# Patient Record
Sex: Female | Born: 1960 | Race: White | Hispanic: No | Smoking: Never smoker
Health system: Southern US, Community
[De-identification: ages and names within clinical notes are randomized; demographics above are authoritative.]

## PROBLEM LIST (undated history)

## (undated) DIAGNOSIS — E669 Obesity, unspecified: Secondary | ICD-10-CM

## (undated) DIAGNOSIS — E119 Type 2 diabetes mellitus without complications: Secondary | ICD-10-CM

## (undated) DIAGNOSIS — I10 Essential (primary) hypertension: Secondary | ICD-10-CM

## (undated) DIAGNOSIS — E785 Hyperlipidemia, unspecified: Secondary | ICD-10-CM

## (undated) DIAGNOSIS — E559 Vitamin D deficiency, unspecified: Secondary | ICD-10-CM

## (undated) DIAGNOSIS — G629 Polyneuropathy, unspecified: Secondary | ICD-10-CM

## (undated) HISTORY — DX: Hyperlipidemia, unspecified: E78.5

## (undated) HISTORY — PX: KNEE SURGERY: SHX244

## (undated) HISTORY — PX: ABDOMINAL HYSTERECTOMY: SHX81

## (undated) HISTORY — DX: Type 2 diabetes mellitus without complications: E11.9

## (undated) HISTORY — DX: Essential (primary) hypertension: I10

## (undated) HISTORY — DX: Vitamin D deficiency, unspecified: E55.9

## (undated) HISTORY — DX: Polyneuropathy, unspecified: G62.9

## (undated) HISTORY — DX: Obesity, unspecified: E66.9

---

## 2012-12-28 ENCOUNTER — Other Ambulatory Visit: Payer: Self-pay | Admitting: *Deleted

## 2012-12-28 MED ORDER — METFORMIN HCL 500 MG PO TABS: 500.0000 mg | ORAL_TABLET | Freq: Two times a day (BID) | ORAL | Status: DC

## 2013-01-04 ENCOUNTER — Ambulatory Visit (INDEPENDENT_AMBULATORY_CARE_PROVIDER_SITE_OTHER): Payer: PRIVATE HEALTH INSURANCE | Admitting: Pharmacist

## 2013-01-04 VITALS — BP 110/70 | HR 70 | Ht 65.5 in | Wt 277.0 lb

## 2013-01-04 DIAGNOSIS — G629 Polyneuropathy, unspecified: Secondary | ICD-10-CM | POA: Insufficient documentation

## 2013-01-04 DIAGNOSIS — E119 Type 2 diabetes mellitus without complications: Secondary | ICD-10-CM

## 2013-01-04 DIAGNOSIS — E559 Vitamin D deficiency, unspecified: Secondary | ICD-10-CM

## 2013-01-04 DIAGNOSIS — E785 Hyperlipidemia, unspecified: Secondary | ICD-10-CM

## 2013-01-04 DIAGNOSIS — G589 Mononeuropathy, unspecified: Secondary | ICD-10-CM

## 2013-01-04 DIAGNOSIS — I10 Essential (primary) hypertension: Secondary | ICD-10-CM | POA: Insufficient documentation

## 2013-01-04 DIAGNOSIS — L301 Dyshidrosis [pompholyx]: Secondary | ICD-10-CM

## 2013-01-04 NOTE — Progress Notes (Signed)
Diabetes Follow-Up Visit Chief Complaint:   Chief Complaint  Patient presents with  . Diabetes    Filed Vitals:   01/04/13 1538  BP: 110/70  Pulse: 70   Filed Weights   01/04/13 1538  Weight: 277 lb (125.646 kg)     HPI: Diagnosed with type 2 DM in December 2013.  She has been taking metformin 500mg  bid since diagnosis.   Tolerating metformin will.   Current Diabetes Medications:  metformin (generic)  Exam Edema:  Positive - wears support stockings  Polyuria:  neg  Polydipsia:  neg Polyphagia:  neg  BMI:  There is no height or weight on file to calculate BMI.   Weight changes:  Has decreased from 291# to 277# today (14 #) General Appearance:  obese Mood/Affect:  normal   Low fat/carbohydrate diet?  Yes - has cut out sweet and reduced CHO's Nicotine Abuse?  No Medication Compliance?  Yes Exercise?  No Alcohol Abuse?  No  Home BG Monitoring:  Checking 1 times a day. Average:  130   High: 181  Low:  108   Lab Results  Component Value Date   HGBA1C 6.5 01/04/2013   RBG = 115 today (2 hours after meal)  Assessment: 1.  Diabetes.  Great A1c reduction from 7.5 to 6.5% 2.  Blood Pressure.  Well controlled 3.  Lipids.  Due to recheck but pt not fasting today  Recommendations: 1.  Medication recommendations at this time are as follows:  Continue metformin 500mg  bid 2.  Reviewed HBG goals:  Fasting 80-130 and 1-2 hour post prandial <180.  Patient is instructed to check BG 1 times per day.    3.  BP goal < 140/80. 4.  LDL goal of < 100, HDL > 40 and TG < 150. 5.  Eye Exam yearly and Dental Exam every 6 months. 6.  Dietary recommendations:  Continue to limit calories and CHO's  7.  Physical Activity recommendations:  Encouraged to start walking or other exercise she enjoys.  Start with 10 to 20 minutes at least 4 days per week - goal 30 to 40 minutes.  8.  Return to clinic in 4-6 wks   Time spent counseling patient:  30 minutes    Henrene Pastor, PharmD, CPP

## 2013-01-04 NOTE — Patient Instructions (Signed)
BLOOD GLUCOSE GOALS: Fasting  80-130 1 to 2 hours within start of a meal   Less than 180

## 2013-01-22 ENCOUNTER — Other Ambulatory Visit: Payer: Self-pay | Admitting: Nurse Practitioner

## 2013-01-22 NOTE — Telephone Encounter (Signed)
LAST OV WITH TAMMY 1/14. AIC 7.5

## 2013-01-29 ENCOUNTER — Other Ambulatory Visit: Payer: Self-pay | Admitting: Nurse Practitioner

## 2013-02-03 ENCOUNTER — Other Ambulatory Visit: Payer: Self-pay | Admitting: Nurse Practitioner

## 2013-02-05 ENCOUNTER — Encounter: Payer: Self-pay | Admitting: Nurse Practitioner

## 2013-02-05 ENCOUNTER — Ambulatory Visit (INDEPENDENT_AMBULATORY_CARE_PROVIDER_SITE_OTHER): Payer: PRIVATE HEALTH INSURANCE | Admitting: Nurse Practitioner

## 2013-02-05 VITALS — BP 123/66 | HR 63 | Temp 98.3°F | Ht 65.5 in | Wt 274.0 lb

## 2013-02-05 DIAGNOSIS — E559 Vitamin D deficiency, unspecified: Secondary | ICD-10-CM

## 2013-02-05 DIAGNOSIS — IMO0001 Reserved for inherently not codable concepts without codable children: Secondary | ICD-10-CM

## 2013-02-05 DIAGNOSIS — R609 Edema, unspecified: Secondary | ICD-10-CM

## 2013-02-05 DIAGNOSIS — E785 Hyperlipidemia, unspecified: Secondary | ICD-10-CM

## 2013-02-05 DIAGNOSIS — E1169 Type 2 diabetes mellitus with other specified complication: Secondary | ICD-10-CM | POA: Insufficient documentation

## 2013-02-05 DIAGNOSIS — I10 Essential (primary) hypertension: Secondary | ICD-10-CM

## 2013-02-05 LAB — COMPLETE METABOLIC PANEL WITH GFR
ALT: 17 U/L (ref 0–35)
Albumin: 3.7 g/dL (ref 3.5–5.2)
CO2: 27 mEq/L (ref 19–32)
Calcium: 9.2 mg/dL (ref 8.4–10.5)
Chloride: 106 mEq/L (ref 96–112)
GFR, Est African American: >89 mL/min
Sodium: 139 mEq/L (ref 135–145)
Total Protein: 6 g/dL (ref 6.0–8.3)

## 2013-02-05 LAB — POCT GLYCOSYLATED HEMOGLOBIN (HGB A1C): Hemoglobin A1C: 5.9

## 2013-02-05 MED ORDER — METFORMIN HCL 500 MG PO TABS: 500.0000 mg | ORAL_TABLET | Freq: Two times a day (BID) | ORAL | Status: DC

## 2013-02-05 MED ORDER — AMLODIPINE-OLMESARTAN 5-40 MG PO TABS: 1.0000 | ORAL_TABLET | Freq: Every day | ORAL | Status: DC

## 2013-02-05 MED ORDER — ATORVASTATIN CALCIUM 40 MG PO TABS: 40.0000 mg | ORAL_TABLET | Freq: Every day | ORAL | Status: DC

## 2013-02-05 MED ORDER — FUROSEMIDE 20 MG PO TABS: 20.0000 mg | ORAL_TABLET | Freq: Every day | ORAL | Status: DC

## 2013-02-05 MED ORDER — EZETIMIBE 10 MG PO TABS: 10.0000 mg | ORAL_TABLET | Freq: Every day | ORAL | Status: DC

## 2013-02-05 NOTE — Progress Notes (Signed)
Subjective:    Patient ID: Carla Mason, female    DOB: 10-Jul-1961, 52 y.o.   MRN: [id number]  Hypertension This is a chronic problem. The current episode started more than 1 year ago. The problem is unchanged. The problem is controlled. Associated symptoms include peripheral edema. Pertinent negatives include no blurred vision, chest pain, headaches, malaise/fatigue or palpitations. There are no associated agents to hypertension. Risk factors for coronary artery disease include diabetes mellitus, dyslipidemia, post-menopausal state, obesity and sedentary lifestyle. Past treatments include calcium channel blockers and angiotensin blockers. The current treatment provides significant improvement. Compliance problems include diet and exercise.   Hyperlipidemia This is a chronic problem. The current episode started more than 1 year ago. The problem is uncontrolled. Recent lipid tests were reviewed and are high. Exacerbating diseases include diabetes and obesity. Pertinent negatives include no chest pain. Current antihyperlipidemic treatment includes ezetimibe and statins. The current treatment provides moderate improvement of lipids. Compliance problems include adherence to diet and adherence to exercise.  Risk factors for coronary artery disease include diabetes mellitus, hypertension, obesity, post-menopausal and a sedentary lifestyle.  Diabetes She presents for her follow-up diabetic visit. She has type 2 diabetes mellitus. No MedicAlert identification noted. The initial diagnosis of diabetes was made 5 months ago. Her disease course has been stable. There are no hypoglycemic associated symptoms. Pertinent negatives for hypoglycemia include no headaches. Associated symptoms include foot paresthesias. Pertinent negatives for diabetes include no blurred vision, no chest pain, no polydipsia, no polyphagia, no polyuria and no visual change. There are no hypoglycemic complications. Symptoms are stable. Current  diabetic treatment includes oral agent (monotherapy). She is compliant with treatment all of the time. Her weight is stable. When asked about meal planning, she reported none. She has not had a previous visit with a dietician. She rarely participates in exercise. Her home blood glucose trend is decreasing steadily. Her breakfast blood glucose is taken between 7-8 am. Her breakfast blood glucose range is generally 110-130 mg/dl. Her overall blood glucose range is 110-130 mg/dl. An ACE inhibitor/angiotensin II receptor blocker is being taken. She does not see a podiatrist.Eye exam is current (oct 2013).  peripheral edema Currently on lasix- working well - still has swelling when she is on her feet at work.   Review of Systems  Constitutional: Negative for malaise/fatigue.  Eyes: Negative for blurred vision.  Cardiovascular: Negative for chest pain and palpitations.  Endocrine: Negative for polydipsia, polyphagia and polyuria.  Neurological: Negative for headaches.  All other systems reviewed and are negative.       Objective:   Physical Exam  Constitutional: She is oriented to person, place, and time. She appears well-developed and well-nourished.  HENT:  Nose: Nose normal.  Mouth/Throat: Oropharynx is clear and moist.  Eyes: EOM are normal.  Neck: Trachea normal, normal range of motion and full passive range of motion without pain. Neck supple. No JVD present. Carotid bruit is not present. No thyromegaly present.  Cardiovascular: Normal rate, regular rhythm, normal heart sounds and intact distal pulses.  Exam reveals no gallop and no friction rub.   No murmur heard. Pulmonary/Chest: Effort normal and breath sounds normal.  Abdominal: Soft. Bowel sounds are normal. She exhibits no distension and no mass. There is no tenderness.  Musculoskeletal: Normal range of motion.  Lymphadenopathy:    She has no cervical adenopathy.  Neurological: She is alert and oriented to person, place, and  time. She has normal reflexes.  Positive 3/4 monofilamnet bil feet  Skin:  Skin is warm and dry.  Early callus formation bil feet  Psychiatric: She has a normal mood and affect. Her behavior is normal. Judgment and thought content normal.   BP 123/66  Pulse 63  Temp(Src) 98.3 F (36.8 C) (Oral)  Ht 5' 5.5" (1.664 m)  Wt 274 lb (124.286 kg)  BMI 44.89 kg/m2 Results for orders placed in visit on 02/05/13  POCT GLYCOSYLATED HEMOGLOBIN (HGB A1C)      Result Value Range   Hemoglobin A1C 5.9%            Assessment & Plan:  1. Diabetes mellitus type 1, uncontrolled, without complications Low carb diet - POCT glycosylated hemoglobin (Hb A1C) - metFORMIN (GLUCOPHAGE) 500 MG tablet; Take 1 tablet (500 mg total) by mouth 2 (two) times daily with a meal.  Dispense: 180 tablet; Refill: 1  2. Hypertension Low na+ DIET - COMPLETE METABOLIC PANEL WITH GFR - amLODipine-olmesartan (AZOR) 5-40 MG per tablet; Take 1 tablet by mouth daily.  Dispense: 90 tablet; Refill: 3  3. Hyperlipidemia LOW FAT DIET AND EXERCISE - NMR Lipoprofile with Lipids - atorvastatin (LIPITOR) 40 MG tablet; Take 1 tablet (40 mg total) by mouth daily.  Dispense: 90 tablet; Refill: 3 - ezetimibe (ZETIA) 10 MG tablet; Take 1 tablet (10 mg total) by mouth daily.  Dispense: 90 tablet; Refill: 3  4. Unspecified vitamin D deficiency  - Vitamin D 25 hydroxy  5. Edema Elevate legs when sitting - furosemide (LASIX) 20 MG tablet; Take 1 tablet (20 mg total) by mouth daily.  Dispense: 90 tablet; Refill: 3   Mary-Margaret Daphine Deutscher, FNP

## 2013-02-05 NOTE — Patient Instructions (Signed)
Diets for Diabetes, Food Labeling Look at food labels to help you decide how much of a product you can eat. You will want to check the amount of total carbohydrate in a serving to see how the food fits into your meal plan. In the list of ingredients, the ingredient present in the largest amount by weight must be listed first, followed by the other ingredients in descending order. STANDARD OF IDENTITY Most products have a list of ingredients. However, foods that the Food and Drug Administration (FDA) has given a standard of identity do not need a list of ingredients. A standard of identity means that a food must contain certain ingredients if it is called a particular name. Examples are mayonnaise, peanut butter, ketchup, jelly, and cheese. LABELING TERMS There are many terms found on food labels. Some of these terms have specific definitions. Some terms are regulated by the FDA, and the FDA has clearly specified how they can be used. Others are not regulated or well-defined and can be misleading and confusing. SPECIFICALLY DEFINED TERMS Nutritive Sweetener.  A sweetener that contains calories,such as table sugar or honey. Nonnutritive Sweetener.  A sweetener with few or no calories,such as saccharin, aspartame, sucralose, and cyclamate. LABELING TERMS REGULATED BY THE FDA Free.  The product contains only a tiny or small amount of fat, cholesterol, sodium, sugar, or calories. For example, a "fat-free" product will contain less than 0.5 g of fat per serving. Low.  A food described as "low" in fat, saturated fat, cholesterol, sodium, or calories could be eaten fairly often without exceeding dietary guidelines. For example, "low in fat" means no more than 3 g of fat per serving. Lean.  "Lean" and "extra lean" are U.S. Department of Agriculture (USDA) terms for use on meat and poultry products. "Lean" means the product contains less than 10 g of fat, 4 g of saturated fat, and 95 mg of cholesterol  per serving. "Lean" is not as low in fat as a product labeled "low." Extra Lean.  "Extra lean" means the product contains less than 5 g of fat, 2 g of saturated fat, and 95 mg of cholesterol per serving. While "extra lean" has less fat than "lean," it is still higher in fat than a product labeled "low." Reduced, Less, Fewer.  A diet product that contains 25% less of a nutrient or calories than the regular version. For example, hot dogs might be labeled "25% less fat than our regular hot dogs." Light/Lite.  A diet product that contains  fewer calories or  the fat of the original. For example, "light in sodium" means a product with  the usual sodium. More.  One serving contains at least 10% more of the daily value of a vitamin, mineral, or fiber than usual. Good Source Of.  One serving contains 10% to 19% of the daily value for a particular vitamin, mineral, or fiber. Excellent Source Of.  One serving contains 20% or more of the daily value for a particular nutrient. Other terms used might be "high in" or "rich in." Enriched or Fortified.  The product contains added vitamins, minerals, or protein. Nutrition labeling must be used on enriched or fortified foods. Imitation.  The product has been altered so that it is lower in protein, vitamins, or minerals than the usual food,such as imitation peanut butter. Total Fat.  The number listed is the total of all fat found in a serving of the product. Under total fat, food labels must list saturated fat and   trans fat, which are associated with raising bad cholesterol and an increased risk of heart blood vessel disease. Saturated Fat.  Mainly fats from animal-based sources. Some examples are red meat, cheese, cream, whole milk, and coconut oil. Trans Fat.  Found in some fried snack foods, packaged foods, and fried restaurant foods. It is recommended you eat as close to 0 g of trans fat as possible, since it raises bad cholesterol and lowers  good cholesterol. Polyunsaturated and Monounsaturated Fats.  More healthful fats. These fats are from plant sources. Total Carbohydrate.  The number of carbohydrate grams in a serving of the product. Under total carbohydrate are listed the other carbohydrate sources, such as dietary fiber and sugars. Dietary Fiber.  A carbohydrate from plant sources. Sugars.  Sugars listed on the label contain all naturally occurring sugars as well as added sugars. LABELING TERMS NOT REGULATED BY THE FDA Sugarless.  Table sugar (sucrose) has not been added. However, the manufacturer may use another form of sugar in place of sucrose to sweeten the product. For example, sugar alcohols are used to sweeten foods. Sugar alcohols are a form of sugar but are not table sugar. If a product contains sugar alcohols in place of sucrose, it can still be labeled "sugarless." Low Salt, Salt-Free, Unsalted, No Salt, No Salt Added, Without Added Salt.  Food that is usually processed with salt has been made without salt. However, the food may contain sodium-containing additives, such as preservatives, leavening agents, or flavorings. Natural.  This term has no legal meaning. Organic.  Foods that are certified as organic have been inspected and approved by the USDA to ensure they are produced without pesticides, fertilizers containing synthetic ingredients, bioengineering, or ionizing radiation. Document Released: 09/12/2003 Document Revised: 12/02/2011 Document Reviewed: 03/30/2009 [employer] Patient Information 2013 [employer].  

## 2013-02-06 LAB — VITAMIN D 25 HYDROXY (VIT D DEFICIENCY, FRACTURES): Vit D, 25-Hydroxy: 35 ng/mL (ref 30–89)

## 2013-02-11 LAB — NMR LIPOPROFILE WITH LIPIDS
HDL Particle Number: 23.2 umol/L — ABNORMAL LOW (ref >=30.5)
LDL Size: 19.6 nm — ABNORMAL LOW (ref >20.5)
Large HDL-P: <1.3 umol/L — ABNORMAL LOW (ref >=4.8)
Small LDL Particle Number: 719 nmol/L — ABNORMAL HIGH (ref <=527)

## 2013-04-02 ENCOUNTER — Telehealth: Payer: Self-pay | Admitting: Nurse Practitioner

## 2013-04-02 ENCOUNTER — Ambulatory Visit (INDEPENDENT_AMBULATORY_CARE_PROVIDER_SITE_OTHER): Payer: PRIVATE HEALTH INSURANCE | Admitting: Family Medicine

## 2013-04-02 ENCOUNTER — Encounter: Payer: Self-pay | Admitting: Family Medicine

## 2013-04-02 VITALS — BP 111/68 | HR 77 | Temp 99.3°F | Wt 266.0 lb

## 2013-04-02 DIAGNOSIS — J029 Acute pharyngitis, unspecified: Secondary | ICD-10-CM

## 2013-04-02 LAB — POCT RAPID STREP A (OFFICE): Rapid Strep A Screen: NEGATIVE

## 2013-04-02 MED ORDER — AZITHROMYCIN 250 MG PO TABS: ORAL_TABLET | ORAL | Status: DC

## 2013-04-02 NOTE — Patient Instructions (Signed)
Strep Throat  Strep throat is an infection of the throat caused by a bacteria named Streptococcus pyogenes. Your caregiver may call the infection streptococcal "tonsillitis" or "pharyngitis" depending on whether there are signs of inflammation in the tonsils or back of the throat. Strep throat is most common in children aged 54 15 years during the cold months of the year, but it can occur in people of any age during any season. This infection is spread from person to person (contagious) through coughing, sneezing, or other close contact.  SYMPTOMS   · Fever or chills.  · Painful, swollen, red tonsils or throat.  · Pain or difficulty when swallowing.  · White or yellow spots on the tonsils or throat.  · Swollen, tender lymph nodes or "glands" of the neck or under the jaw.  · Red rash all over the body (rare).  DIAGNOSIS   Many different infections can cause the same symptoms. A test must be done to confirm the diagnosis so the right treatment can be given. A "rapid strep test" can help your caregiver make the diagnosis in a few minutes. If this test is not available, a light swab of the infected area can be used for a throat culture test. If a throat culture test is done, results are usually available in a day or two.  TREATMENT   Strep throat is treated with antibiotic medicine.  HOME CARE INSTRUCTIONS   · Gargle with 1 tsp of salt in 1 cup of warm water, 3 4 times per day or as needed for comfort.  · Family members who also have a sore throat or fever should be tested for strep throat and treated with antibiotics if they have the strep infection.  · Make sure everyone in your household washes their hands well.  · Do not share food, drinking cups, or personal items that could cause the infection to spread to others.  · You may need to eat a soft food diet until your sore throat gets better.  · Drink enough water and fluids to keep your urine clear or pale yellow. This will help prevent dehydration.  · Get plenty of  rest.  · Stay home from school, daycare, or work until you have been on antibiotics for 24 hours.  · Only take over-the-counter or prescription medicines for pain, discomfort, or fever as directed by your caregiver.  · If antibiotics are prescribed, take them as directed. Finish them even if you start to feel better.  SEEK MEDICAL CARE IF:   · The glands in your neck continue to enlarge.  · You develop a rash, cough, or earache.  · You cough up green, yellow-brown, or bloody sputum.  · You have pain or discomfort not controlled by medicines.  · Your problems seem to be getting worse rather than better.  SEEK IMMEDIATE MEDICAL CARE IF:   · You develop any new symptoms such as vomiting, severe headache, stiff or painful neck, chest pain, shortness of breath, or trouble swallowing.  · You develop severe throat pain, drooling, or changes in your voice.  · You develop swelling of the neck, or the skin on the neck becomes red and tender.  · You have a fever.  · You develop signs of dehydration, such as fatigue, dry mouth, and decreased urination.  · You become increasingly sleepy, or you cannot wake up completely.  Document Released: 09/06/2000 Document Revised: 08/26/2012 Document Reviewed: 11/08/2010  [employer]® Patient Information ©2014 [employer].

## 2013-04-02 NOTE — Progress Notes (Signed)
  Subjective:    Patient ID: Carla Mason, female    DOB: 12/21/1960, 52 y.o.   MRN: [id number]  HPI This 52 y.o. female presents for evaluation of sore throat for 2 days.  She has been having a sore throat  For 2 days and she is having some sinus drainage and congestion.  She did cough up some yellow Sputum this am.   Review of Systems C/o sinus drainage and sore throat.  No chest pain, SOB, HA, dizziness, vision change, N/V, diarrhea, constipation, dysuria, urinary urgency or frequency, myalgias, arthralgias or rash.     Objective:   Physical Exam  Vital signs noted  Well developed well nourished female.  HEENT - Head atraumatic Normocephalic                Eyes - PERRLA, Conjuctiva - clear Sclera- Clear EOMI                Ears - EAC's Wnl TM's Wnl Gross Hearing WNL                Nose - Nares patent                 Throat - oropharanx injected. Respiratory - Lungs CTA bilateral Cardiac - RRR S1 and S2 without murmur GI - Abdomen soft Nontender and bowel sounds active x 4 Extremities - No edema. Neuro - Grossly intact.      Assessment & Plan:  Sore throat - Plan: POCT rapid strep A, azithromycin (ZITHROMAX) 250 MG tablet Warm Salt water gargles, tylenol and motrin otc prn.    Acute pharyngitis - Plan: azithromycin (ZITHROMAX) 250 MG tablet, push po fluids, rest and follow up Prn if sx's continue or persist.

## 2013-04-02 NOTE — Telephone Encounter (Signed)
appt given for today 

## 2013-05-28 ENCOUNTER — Other Ambulatory Visit: Payer: Self-pay | Admitting: Nurse Practitioner

## 2013-06-17 ENCOUNTER — Other Ambulatory Visit: Payer: Self-pay | Admitting: Family Medicine

## 2013-07-23 ENCOUNTER — Telehealth: Payer: Self-pay | Admitting: Nurse Practitioner

## 2013-07-23 DIAGNOSIS — IMO0001 Reserved for inherently not codable concepts without codable children: Secondary | ICD-10-CM

## 2013-07-26 MED ORDER — METFORMIN HCL 500 MG PO TABS: 500.0000 mg | ORAL_TABLET | Freq: Two times a day (BID) | ORAL | Status: DC

## 2013-07-26 NOTE — Telephone Encounter (Signed)
done

## 2013-08-13 ENCOUNTER — Other Ambulatory Visit: Payer: Self-pay | Admitting: Nurse Practitioner

## 2013-08-18 ENCOUNTER — Ambulatory Visit (INDEPENDENT_AMBULATORY_CARE_PROVIDER_SITE_OTHER): Payer: PRIVATE HEALTH INSURANCE | Admitting: Nurse Practitioner

## 2013-08-18 ENCOUNTER — Encounter: Payer: Self-pay | Admitting: Nurse Practitioner

## 2013-08-18 VITALS — BP 127/82 | HR 78 | Temp 98.8°F | Ht 65.5 in | Wt 259.0 lb

## 2013-08-18 DIAGNOSIS — R609 Edema, unspecified: Secondary | ICD-10-CM

## 2013-08-18 DIAGNOSIS — I10 Essential (primary) hypertension: Secondary | ICD-10-CM

## 2013-08-18 DIAGNOSIS — E119 Type 2 diabetes mellitus without complications: Secondary | ICD-10-CM

## 2013-08-18 DIAGNOSIS — G589 Mononeuropathy, unspecified: Secondary | ICD-10-CM

## 2013-08-18 DIAGNOSIS — E785 Hyperlipidemia, unspecified: Secondary | ICD-10-CM

## 2013-08-18 DIAGNOSIS — G629 Polyneuropathy, unspecified: Secondary | ICD-10-CM

## 2013-08-18 LAB — POCT UA - MICROALBUMIN: Microalbumin Ur, POC: POSITIVE mg/L

## 2013-08-18 MED ORDER — FUROSEMIDE 20 MG PO TABS: 20.0000 mg | ORAL_TABLET | Freq: Every day | ORAL | Status: DC

## 2013-08-18 MED ORDER — AMLODIPINE-OLMESARTAN 5-40 MG PO TABS: 1.0000 | ORAL_TABLET | Freq: Every day | ORAL | Status: DC

## 2013-08-18 MED ORDER — EZETIMIBE 10 MG PO TABS: 10.0000 mg | ORAL_TABLET | Freq: Every day | ORAL | Status: DC

## 2013-08-18 MED ORDER — ATORVASTATIN CALCIUM 40 MG PO TABS: 40.0000 mg | ORAL_TABLET | Freq: Every day | ORAL | Status: DC

## 2013-08-18 MED ORDER — METFORMIN HCL 500 MG PO TABS: 500.0000 mg | ORAL_TABLET | Freq: Two times a day (BID) | ORAL | Status: DC

## 2013-08-18 NOTE — Patient Instructions (Signed)

## 2013-08-18 NOTE — Addendum Note (Signed)
Addended by: Prescott Gum on: 08/18/2013 04:41 PM   Modules accepted: Orders

## 2013-08-18 NOTE — Progress Notes (Signed)
Subjective:    Patient ID: Carla Mason, female    DOB: 01-Nov-1960, 52 y.o.   MRN: [id number]  Hypertension This is a chronic problem. The current episode started more than 1 year ago. The problem is unchanged. The problem is controlled. Associated symptoms include peripheral edema. Pertinent negatives include no blurred vision, chest pain, headaches, malaise/fatigue or palpitations. There are no associated agents to hypertension. Risk factors for coronary artery disease include diabetes mellitus, dyslipidemia, post-menopausal state, obesity and sedentary lifestyle. Past treatments include calcium channel blockers and angiotensin blockers. The current treatment provides significant improvement. Compliance problems include diet and exercise.   Hyperlipidemia This is a chronic problem. The current episode started more than 1 year ago. The problem is uncontrolled. Recent lipid tests were reviewed and are high. Exacerbating diseases include diabetes and obesity. Pertinent negatives include no chest pain. Current antihyperlipidemic treatment includes ezetimibe and statins. The current treatment provides moderate improvement of lipids. Compliance problems include adherence to diet and adherence to exercise.  Risk factors for coronary artery disease include diabetes mellitus, hypertension, obesity, post-menopausal and a sedentary lifestyle.  Diabetes She presents for her follow-up diabetic visit. She has type 2 diabetes mellitus. No MedicAlert identification noted. The initial diagnosis of diabetes was made 5 months ago. Her disease course has been stable. There are no hypoglycemic associated symptoms. Pertinent negatives for hypoglycemia include no headaches. Associated symptoms include foot paresthesias. Pertinent negatives for diabetes include no blurred vision, no chest pain, no polydipsia, no polyphagia, no polyuria and no visual change. There are no hypoglycemic complications. Symptoms are stable. Current  diabetic treatment includes oral agent (monotherapy). She is compliant with treatment all of the time. Her weight is stable. When asked about meal planning, she reported none. She has not had a previous visit with a dietician. She rarely participates in exercise. Her home blood glucose trend is decreasing steadily. Her breakfast blood glucose is taken between 7-8 am. Her breakfast blood glucose range is generally 110-130 mg/dl. Her overall blood glucose range is 110-130 mg/dl. An ACE inhibitor/angiotensin II receptor blocker is being taken. She does not see a podiatrist.Eye exam is current (oct 2013).  peripheral edema Currently on lasix- working well - still has swelling when she is on her feet at work.   Review of Systems  Constitutional: Negative for malaise/fatigue.  Eyes: Negative for blurred vision.  Cardiovascular: Negative for chest pain and palpitations.  Endocrine: Negative for polydipsia, polyphagia and polyuria.  Neurological: Negative for headaches.  All other systems reviewed and are negative.       Objective:   Physical Exam  Constitutional: She is oriented to person, place, and time. She appears well-developed and well-nourished.  HENT:  Nose: Nose normal.  Mouth/Throat: Oropharynx is clear and moist.  Eyes: EOM are normal.  Neck: Trachea normal, normal range of motion and full passive range of motion without pain. Neck supple. No JVD present. Carotid bruit is not present. No thyromegaly present.  Cardiovascular: Normal rate, regular rhythm, normal heart sounds and intact distal pulses.  Exam reveals no gallop and no friction rub.   No murmur heard. Pulmonary/Chest: Effort normal and breath sounds normal.  Abdominal: Soft. Bowel sounds are normal. She exhibits no distension and no mass. There is no tenderness.  Musculoskeletal: Normal range of motion.  Lymphadenopathy:    She has no cervical adenopathy.  Neurological: She is alert and oriented to person, place, and  time. She has normal reflexes.  Positive 3/4 monofilamnet bil feet  Skin:  Skin is warm and dry.  Early callus formation bil feet  Psychiatric: She has a normal mood and affect. Her behavior is normal. Judgment and thought content normal.   BP 127/82  Pulse 78  Temp(Src) 98.8 F (37.1 C) (Oral)  Ht 5' 5.5" (1.664 m)  Wt 259 lb (117.482 kg)  BMI 42.43 kg/m2   Results for orders placed in visit on 08/18/13  POCT GLYCOSYLATED HEMOGLOBIN (HGB A1C)      Result Value Range   Hemoglobin A1C 5.8%         Assessment & Plan:   1. Type 2 diabetes mellitus   2. Hyperlipidemia   3. Essential hypertension, benign   4. Neuropathy   5. Other and unspecified hyperlipidemia   6. Hypertension   7. Edema    Orders Placed This Encounter  Procedures  . CMP14+EGFR  . NMR, lipoprofile  . POCT glycosylated hemoglobin (Hb A1C)   Meds ordered this encounter  Medications  . amLODipine-olmesartan (AZOR) 5-40 MG per tablet    Sig: Take 1 tablet by mouth daily.    Dispense:  90 tablet    Refill:  1    Order Specific Question:  Supervising Provider    Answer:  Ernestina Penna [[id number]]  . atorvastatin (LIPITOR) 40 MG tablet    Sig: Take 1 tablet (40 mg total) by mouth daily.    Dispense:  90 tablet    Refill:  1    Order Specific Question:  Supervising Provider    Answer:  Ernestina Penna [[id number]]  . ezetimibe (ZETIA) 10 MG tablet    Sig: Take 1 tablet (10 mg total) by mouth daily.    Dispense:  90 tablet    Refill:  1    Order Specific Question:  Supervising Provider    Answer:  Ernestina Penna [[id number]]  . furosemide (LASIX) 20 MG tablet    Sig: Take 1 tablet (20 mg total) by mouth daily.    Dispense:  90 tablet    Refill:  1    Order Specific Question:  Supervising Provider    Answer:  Ernestina Penna [[id number]]  . metFORMIN (GLUCOPHAGE) 500 MG tablet    Sig: Take 1 tablet (500 mg total) by mouth 2 (two) times daily with a meal.    Dispense:  180 tablet    Refill:  1    Order Specific  Question:  Supervising Provider    Answer:  Deborra Medina    Continue all meds Labs pending Diet and exercise encouraged Health maintenance reviewed Follow up in 3 months  Mary-Margaret Daphine Deutscher, FNP

## 2013-08-21 LAB — CMP14+EGFR
ALT: 23 IU/L (ref 0–32)
Albumin: 4.4 g/dL (ref 3.5–5.5)
Alkaline Phosphatase: 93 IU/L (ref 39–117)
BUN/Creatinine Ratio: 18 (ref 9–23)
BUN: 12 mg/dL (ref 6–24)
CO2: 27 mmol/L (ref 18–29)
Chloride: 100 mmol/L (ref 97–108)
Glucose: 119 mg/dL — ABNORMAL HIGH (ref 65–99)
Potassium: 4.3 mmol/L (ref 3.5–5.2)
Total Bilirubin: 0.7 mg/dL (ref 0.0–1.2)
Total Protein: 6.7 g/dL (ref 6.0–8.5)

## 2013-08-21 LAB — NMR, LIPOPROFILE
Cholesterol: 98 mg/dL (ref <200)
LDL Particle Number: 817 nmol/L (ref <1000)
LDL Size: 19.6 nm — ABNORMAL LOW (ref >20.5)
LDLC SERPL CALC-MCNC: 33 mg/dL (ref <100)
LP-IR Score: 57 — ABNORMAL HIGH (ref <=45)

## 2013-08-24 ENCOUNTER — Telehealth: Payer: Self-pay | Admitting: Family Medicine

## 2013-08-24 NOTE — Telephone Encounter (Signed)
Message copied by Azalee Course on Tue Aug 24, 2013  9:39 AM ------      Message from: Bennie Pierini      Created: Tue Aug 24, 2013  8:53 AM       hgba1c discussed at appointment      cmp normal      Cholesterol looks good      Continue all meds and recheck in 3 months       ------

## 2013-08-26 NOTE — Telephone Encounter (Signed)
Message copied by Tamera Punt on Thu Aug 26, 2013  5:04 PM ------      Message from: Bennie Pierini      Created: Tue Aug 24, 2013  8:53 AM       hgba1c discussed at appointment      cmp normal      Cholesterol looks good      Continue all meds and recheck in 3 months       ------

## 2013-08-26 NOTE — Telephone Encounter (Signed)
Patient aware of results.

## 2013-10-07 ENCOUNTER — Other Ambulatory Visit: Payer: Self-pay | Admitting: Family Medicine

## 2013-11-28 ENCOUNTER — Other Ambulatory Visit: Payer: Self-pay | Admitting: Family Medicine

## 2013-11-29 NOTE — Telephone Encounter (Signed)
Patient NTBS for follow up and lab work  

## 2013-11-29 NOTE — Telephone Encounter (Signed)
Last seen and last glucose 08/18/13  MMM

## 2013-11-29 NOTE — Telephone Encounter (Signed)
Left message on pt vm that rx sent in and need to call for appt

## 2013-11-30 ENCOUNTER — Telehealth: Payer: Self-pay | Admitting: Nurse Practitioner

## 2013-11-30 NOTE — Telephone Encounter (Signed)
Appt scheduled. Patient aware. 

## 2013-12-03 ENCOUNTER — Encounter: Payer: Self-pay | Admitting: Nurse Practitioner

## 2013-12-03 ENCOUNTER — Ambulatory Visit (INDEPENDENT_AMBULATORY_CARE_PROVIDER_SITE_OTHER): Payer: BC Managed Care – PPO | Admitting: Nurse Practitioner

## 2013-12-03 VITALS — BP 117/72 | HR 65 | Temp 97.6°F | Ht 65.0 in | Wt 262.0 lb

## 2013-12-03 DIAGNOSIS — I10 Essential (primary) hypertension: Secondary | ICD-10-CM

## 2013-12-03 DIAGNOSIS — G589 Mononeuropathy, unspecified: Secondary | ICD-10-CM

## 2013-12-03 DIAGNOSIS — E559 Vitamin D deficiency, unspecified: Secondary | ICD-10-CM

## 2013-12-03 DIAGNOSIS — E785 Hyperlipidemia, unspecified: Secondary | ICD-10-CM

## 2013-12-03 DIAGNOSIS — G629 Polyneuropathy, unspecified: Secondary | ICD-10-CM

## 2013-12-03 DIAGNOSIS — E119 Type 2 diabetes mellitus without complications: Secondary | ICD-10-CM

## 2013-12-03 LAB — POCT GLYCOSYLATED HEMOGLOBIN (HGB A1C): Hemoglobin A1C: 5.9

## 2013-12-03 MED ORDER — METFORMIN HCL 500 MG PO TABS: 500.0000 mg | ORAL_TABLET | Freq: Two times a day (BID) | ORAL | Status: DC

## 2013-12-03 NOTE — Patient Instructions (Signed)

## 2013-12-03 NOTE — Progress Notes (Signed)
Subjective:    Patient ID: Carla Mason, female    DOB: 11/18/1960, 53 y.o.   MRN: [id number]  Patient here today for follow up- doing well today without complaints.  Hypertension This is a chronic problem. The current episode started more than 1 year ago. The problem is unchanged. The problem is controlled. Associated symptoms include peripheral edema. Pertinent negatives include no blurred vision, chest pain, headaches, malaise/fatigue or palpitations. There are no associated agents to hypertension. Risk factors for coronary artery disease include diabetes mellitus, dyslipidemia, post-menopausal state, obesity and sedentary lifestyle. Past treatments include calcium channel blockers and angiotensin blockers. The current treatment provides significant improvement. Compliance problems include diet and exercise.   Hyperlipidemia This is a chronic problem. The current episode started more than 1 year ago. The problem is uncontrolled. Recent lipid tests were reviewed and are high. Exacerbating diseases include diabetes and obesity. Pertinent negatives include no chest pain. Current antihyperlipidemic treatment includes ezetimibe and statins. The current treatment provides moderate improvement of lipids. Compliance problems include adherence to diet and adherence to exercise.  Risk factors for coronary artery disease include diabetes mellitus, hypertension, obesity, post-menopausal and a sedentary lifestyle.  Diabetes She presents for her follow-up diabetic visit. She has type 2 diabetes mellitus. No MedicAlert identification noted. The initial diagnosis of diabetes was made 5 months ago. Her disease course has been stable. There are no hypoglycemic associated symptoms. Pertinent negatives for hypoglycemia include no headaches. Associated symptoms include foot paresthesias. Pertinent negatives for diabetes include no blurred vision, no chest pain, no polydipsia, no polyphagia, no polyuria and no visual  change. There are no hypoglycemic complications. Symptoms are stable. Current diabetic treatment includes oral agent (monotherapy). She is compliant with treatment all of the time. Her weight is stable. When asked about meal planning, she reported none. She has not had a previous visit with a dietician. She rarely participates in exercise. Her home blood glucose trend is decreasing steadily. Her breakfast blood glucose is taken between 7-8 am. Her breakfast blood glucose range is generally 110-130 mg/dl. Her overall blood glucose range is 110-130 mg/dl. An ACE inhibitor/angiotensin II receptor blocker is being taken. She does not see a podiatrist.Eye exam is current (oct 2013).  peripheral edema Currently on lasix- working well - still has swelling when she is on her feet at work.   Review of Systems  Constitutional: Negative for malaise/fatigue.  Eyes: Negative for blurred vision.  Cardiovascular: Negative for chest pain and palpitations.  Endocrine: Negative for polydipsia, polyphagia and polyuria.  Neurological: Negative for headaches.  All other systems reviewed and are negative.       Objective:   Physical Exam  Constitutional: She is oriented to person, place, and time. She appears well-developed and well-nourished.  HENT:  Nose: Nose normal.  Mouth/Throat: Oropharynx is clear and moist.  Eyes: EOM are normal.  Neck: Trachea normal, normal range of motion and full passive range of motion without pain. Neck supple. No JVD present. Carotid bruit is not present. No thyromegaly present.  Cardiovascular: Normal rate, regular rhythm, normal heart sounds and intact distal pulses.  Exam reveals no gallop and no friction rub.   No murmur heard. Pulmonary/Chest: Effort normal and breath sounds normal.  Abdominal: Soft. Bowel sounds are normal. She exhibits no distension and no mass. There is no tenderness.  Musculoskeletal: Normal range of motion.  Lymphadenopathy:    She has no cervical  adenopathy.  Neurological: She is alert and oriented to person, place, and time.  She has normal reflexes.  Positive 3/4 monofilamnet bil feet  Skin: Skin is warm and dry.  Early callus formation bil feet  Psychiatric: She has a normal mood and affect. Her behavior is normal. Judgment and thought content normal.   BP 117/72  Pulse 65  Temp(Src) 97.6 F (36.4 C) (Oral)  Ht '5\' 5"'  (1.651 m)  Wt 262 lb (118.842 kg)  BMI 43.60 kg/m2   Results for orders placed in visit on 12/03/13  POCT GLYCOSYLATED HEMOGLOBIN (HGB A1C)      Result Value Ref Range   Hemoglobin A1C 5.9         Assessment & Plan:   1. Other and unspecified hyperlipidemia   2. Type 2 diabetes mellitus   3. Essential hypertension, benign   4. Vitamin D deficiency disease   5. Neuropathy   6. Hyperlipidemia    Orders Placed This Encounter  Procedures  . CMP14+EGFR  . NMR, lipoprofile  . POCT glycosylated hemoglobin (Hb A1C)   Meds ordered this encounter  Medications  . metFORMIN (GLUCOPHAGE) 500 MG tablet    Sig: Take 1 tablet (500 mg total) by mouth 2 (two) times daily with a meal.    Dispense:  180 tablet    Refill:  1    Order Specific Question:  Supervising Provider    Answer:  Chipper Herb [[id number]]    Labs pending Health maintenance reviewed Diet and exercise encouraged Continue all meds Follow up  In 3 month   [place], FNP

## 2013-12-05 LAB — CMP14+EGFR
A/G RATIO: 1.8 (ref 1.1–2.5)
ALBUMIN: 4.1 g/dL (ref 3.5–5.5)
ALT: 17 IU/L (ref 0–32)
AST: 13 IU/L (ref 0–40)
Alkaline Phosphatase: 74 IU/L (ref 39–117)
BILIRUBIN TOTAL: 0.5 mg/dL (ref 0.0–1.2)
BUN/Creatinine Ratio: 17 (ref 9–23)
BUN: 8 mg/dL (ref 6–24)
CO2: 25 mmol/L (ref 18–29)
CREATININE: 0.46 mg/dL — AB (ref 0.57–1.00)
Calcium: 9.1 mg/dL (ref 8.7–10.2)
Chloride: 100 mmol/L (ref 97–108)
GFR, EST AFRICAN AMERICAN: 130 mL/min/{1.73_m2} (ref >59)
GFR, EST NON AFRICAN AMERICAN: 113 mL/min/{1.73_m2} (ref >59)
Globulin, Total: 2.3 g/dL (ref 1.5–4.5)
Glucose: 105 mg/dL — ABNORMAL HIGH (ref 65–99)
POTASSIUM: 4.1 mmol/L (ref 3.5–5.2)
Sodium: 140 mmol/L (ref 134–144)
TOTAL PROTEIN: 6.4 g/dL (ref 6.0–8.5)

## 2013-12-05 LAB — NMR, LIPOPROFILE
Cholesterol: 169 mg/dL (ref <200)
HDL CHOLESTEROL BY NMR: 32 mg/dL — AB (ref >=40)
HDL PARTICLE NUMBER: 26.6 umol/L — AB (ref >=30.5)
LDL Particle Number: 1837 nmol/L — ABNORMAL HIGH (ref <1000)
LDL Size: 19.8 nm — ABNORMAL LOW (ref >20.5)
LDLC SERPL CALC-MCNC: 99 mg/dL (ref <100)
LP-IR Score: 53 — ABNORMAL HIGH (ref <=45)
Small LDL Particle Number: 1362 nmol/L — ABNORMAL HIGH (ref <=527)
TRIGLYCERIDES BY NMR: 191 mg/dL — AB (ref <150)

## 2013-12-10 ENCOUNTER — Other Ambulatory Visit: Payer: Self-pay | Admitting: Nurse Practitioner

## 2013-12-10 ENCOUNTER — Telehealth: Payer: Self-pay | Admitting: Nurse Practitioner

## 2013-12-10 DIAGNOSIS — E785 Hyperlipidemia, unspecified: Secondary | ICD-10-CM

## 2013-12-10 MED ORDER — ROSUVASTATIN CALCIUM 10 MG PO TABS
10.0000 mg | ORAL_TABLET | Freq: Every day | ORAL | Status: DC
Start: 2013-12-10 — End: 2013-12-31

## 2013-12-10 NOTE — Telephone Encounter (Signed)
Patient aware.

## 2013-12-31 ENCOUNTER — Telehealth: Payer: Self-pay | Admitting: Nurse Practitioner

## 2013-12-31 DIAGNOSIS — E785 Hyperlipidemia, unspecified: Secondary | ICD-10-CM

## 2013-12-31 MED ORDER — ATORVASTATIN CALCIUM 40 MG PO TABS: 40.0000 mg | ORAL_TABLET | Freq: Every day | ORAL | Status: DC

## 2013-12-31 NOTE — Telephone Encounter (Signed)
Patient aware.

## 2013-12-31 NOTE — Telephone Encounter (Signed)
Changed back to lipitor [facility]

## 2014-03-02 ENCOUNTER — Telehealth: Payer: Self-pay | Admitting: Nurse Practitioner

## 2014-03-04 NOTE — Telephone Encounter (Signed)
Patient states at this time that she is no longer running fever and states that she will call back if she needs to be seen

## 2014-05-13 ENCOUNTER — Other Ambulatory Visit: Payer: Self-pay | Admitting: Family Medicine

## 2014-05-16 NOTE — Telephone Encounter (Signed)
Last AIC 3/15- 5.9

## 2014-05-20 ENCOUNTER — Encounter: Payer: Self-pay | Admitting: Nurse Practitioner

## 2014-05-20 ENCOUNTER — Ambulatory Visit (INDEPENDENT_AMBULATORY_CARE_PROVIDER_SITE_OTHER): Payer: BC Managed Care – PPO | Admitting: Nurse Practitioner

## 2014-05-20 ENCOUNTER — Ambulatory Visit (INDEPENDENT_AMBULATORY_CARE_PROVIDER_SITE_OTHER): Payer: BC Managed Care – PPO

## 2014-05-20 VITALS — BP 146/93 | HR 102 | Temp 98.2°F | Ht 65.0 in | Wt 255.0 lb

## 2014-05-20 DIAGNOSIS — G589 Mononeuropathy, unspecified: Secondary | ICD-10-CM

## 2014-05-20 DIAGNOSIS — R609 Edema, unspecified: Secondary | ICD-10-CM

## 2014-05-20 DIAGNOSIS — G629 Polyneuropathy, unspecified: Secondary | ICD-10-CM

## 2014-05-20 DIAGNOSIS — Z713 Dietary counseling and surveillance: Secondary | ICD-10-CM

## 2014-05-20 DIAGNOSIS — E785 Hyperlipidemia, unspecified: Secondary | ICD-10-CM

## 2014-05-20 DIAGNOSIS — E559 Vitamin D deficiency, unspecified: Secondary | ICD-10-CM

## 2014-05-20 DIAGNOSIS — R079 Chest pain, unspecified: Secondary | ICD-10-CM

## 2014-05-20 DIAGNOSIS — I10 Essential (primary) hypertension: Secondary | ICD-10-CM

## 2014-05-20 DIAGNOSIS — E119 Type 2 diabetes mellitus without complications: Secondary | ICD-10-CM

## 2014-05-20 LAB — POCT GLYCOSYLATED HEMOGLOBIN (HGB A1C): Hemoglobin A1C: 5.8

## 2014-05-20 MED ORDER — ATORVASTATIN CALCIUM 40 MG PO TABS: 40.0000 mg | ORAL_TABLET | Freq: Every day | ORAL | Status: DC

## 2014-05-20 MED ORDER — FUROSEMIDE 20 MG PO TABS: 20.0000 mg | ORAL_TABLET | Freq: Every day | ORAL | Status: DC

## 2014-05-20 MED ORDER — AMLODIPINE-OLMESARTAN 5-40 MG PO TABS: 1.0000 | ORAL_TABLET | Freq: Every day | ORAL | Status: DC

## 2014-05-20 MED ORDER — METFORMIN HCL 500 MG PO TABS: 500.0000 mg | ORAL_TABLET | Freq: Two times a day (BID) | ORAL | Status: DC

## 2014-05-20 MED ORDER — EZETIMIBE 10 MG PO TABS: 10.0000 mg | ORAL_TABLET | Freq: Every day | ORAL | Status: DC

## 2014-05-20 NOTE — Patient Instructions (Signed)
Exercise to Lose Weight Exercise and a healthy diet may help you lose weight. Your doctor may suggest specific exercises. EXERCISE IDEAS AND TIPS  Choose low-cost things you enjoy doing, such as walking, bicycling, or exercising to workout videos.  Take stairs instead of the elevator.  Walk during your lunch break.  Park your car further away from work or school.  Go to a gym or an exercise class.  Start with 5 to 10 minutes of exercise each day. Build up to 30 minutes of exercise 4 to 6 days a week.  Wear shoes with good support and comfortable clothes.  Stretch before and after working out.  Work out until you breathe harder and your heart beats faster.  Drink extra water when you exercise.  Do not do so much that you hurt yourself, feel dizzy, or get very short of breath. Exercises that burn about 150 calories:  Running 1  miles in 15 minutes.  Playing volleyball for 45 to 60 minutes.  Washing and waxing a car for 45 to 60 minutes.  Playing touch football for 45 minutes.  Walking 1  miles in 35 minutes.  Pushing a stroller 1  miles in 30 minutes.  Playing basketball for 30 minutes.  Raking leaves for 30 minutes.  Bicycling 5 miles in 30 minutes.  Walking 2 miles in 30 minutes.  Dancing for 30 minutes.  Shoveling snow for 15 minutes.  Swimming laps for 20 minutes.  Walking up stairs for 15 minutes.  Bicycling 4 miles in 15 minutes.  Gardening for 30 to 45 minutes.  Jumping rope for 15 minutes.  Washing windows or floors for 45 to 60 minutes. Document Released: 10/12/2010 Document Revised: 12/02/2011 Document Reviewed: 10/12/2010 [employer] Patient Information 2015 [employer]. This information is not intended to replace advice given to you by your health care provider. Make sure you discuss any questions you have with your health care provider.  

## 2014-05-20 NOTE — Progress Notes (Signed)
Subjective:    Patient ID: Carla Mason, female    DOB: Feb 01, 1961, 53 y.o.   MRN: [id number]  Patient here today for follow up- doing well today without complaints.  Hypertension This is a chronic problem. The current episode started more than 1 year ago. The problem is unchanged. The problem is controlled. Associated symptoms include peripheral edema. Pertinent negatives include no blurred vision, chest pain, headaches, malaise/fatigue or palpitations. There are no associated agents to hypertension. Risk factors for coronary artery disease include diabetes mellitus, dyslipidemia, post-menopausal state, obesity and sedentary lifestyle. Past treatments include calcium channel blockers and angiotensin blockers. The current treatment provides significant improvement. Compliance problems include diet and exercise.   Hyperlipidemia This is a chronic problem. The current episode started more than 1 year ago. The problem is uncontrolled. Recent lipid tests were reviewed and are high. Exacerbating diseases include diabetes and obesity. Pertinent negatives include no chest pain. Current antihyperlipidemic treatment includes ezetimibe and statins. The current treatment provides moderate improvement of lipids. Compliance problems include adherence to diet and adherence to exercise.  Risk factors for coronary artery disease include diabetes mellitus, hypertension, obesity, post-menopausal and a sedentary lifestyle.  Diabetes She presents for her follow-up diabetic visit. She has type 2 diabetes mellitus. No MedicAlert identification noted. The initial diagnosis of diabetes was made 5 months ago. Her disease course has been stable. There are no hypoglycemic associated symptoms. Pertinent negatives for hypoglycemia include no headaches. Associated symptoms include foot paresthesias. Pertinent negatives for diabetes include no blurred vision, no chest pain, no polydipsia, no polyphagia, no polyuria and no visual  change. There are no hypoglycemic complications. Symptoms are stable. Current diabetic treatment includes oral agent (monotherapy). She is compliant with treatment all of the time. Her weight is stable. When asked about meal planning, she reported none. She has not had a previous visit with a dietician. She rarely participates in exercise. Her home blood glucose trend is decreasing steadily. Her breakfast blood glucose is taken between 7-8 am. Her breakfast blood glucose range is generally 110-130 mg/dl. Her overall blood glucose range is 110-130 mg/dl. An ACE inhibitor/angiotensin II receptor blocker is being taken. She does not see a podiatrist.Eye exam is current (oct 2013).  peripheral edema Currently on lasix- working well - still has swelling when she is on her feet at work.   Review of Systems  Constitutional: Negative for malaise/fatigue.  Eyes: Negative for blurred vision.  Cardiovascular: Negative for chest pain and palpitations.  Endocrine: Negative for polydipsia, polyphagia and polyuria.  Neurological: Negative for headaches.  All other systems reviewed and are negative.      Objective:   Physical Exam  Constitutional: She is oriented to person, place, and time. She appears well-developed and well-nourished.  HENT:  Nose: Nose normal.  Mouth/Throat: Oropharynx is clear and moist.  Eyes: EOM are normal.  Neck: Trachea normal, normal range of motion and full passive range of motion without pain. Neck supple. No JVD present. Carotid bruit is not present. No thyromegaly present.  Cardiovascular: Normal rate, regular rhythm, normal heart sounds and intact distal pulses.  Exam reveals no gallop and no friction rub.   No murmur heard. Pulmonary/Chest: Effort normal and breath sounds normal.  Abdominal: Soft. Bowel sounds are normal. She exhibits no distension and no mass. There is no tenderness.  Musculoskeletal: Normal range of motion.  Lymphadenopathy:    She has no cervical  adenopathy.  Neurological: She is alert and oriented to person, place, and time. She  has normal reflexes.  Positive 3/4 monofilamnet bil feet  Skin: Skin is warm and dry.  Early callus formation bil feet  Psychiatric: She has a normal mood and affect. Her behavior is normal. Judgment and thought content normal.   BP 146/93  Pulse 102  Temp(Src) 98.2 F (36.8 C) (Oral)  Ht '5\' 5"'  (1.651 m)  Wt 255 lb (115.667 kg)  BMI 42.43 kg/m2   Results for orders placed in visit on 05/20/14  POCT GLYCOSYLATED HEMOGLOBIN (HGB A1C)      Result Value Ref Range   Hemoglobin A1C 5.8     Chest x ray- chronic bronchitic changes-Preliminary reading by Ronnald Collum, FNP  [facility] EKG- Kerry Hough, FNP     Assessment & Plan:   1. Essential hypertension, benign   2. Hyperlipidemia   3. Type 2 diabetes mellitus without complication   4. Neuropathy   5. Vitamin D deficiency disease   6. Severe obesity (BMI >= 40)   7. Weight loss counseling, encounter for   8. Chest pain, unspecified chest pain type    Orders Placed This Encounter  Procedures  . DG Chest 2 View    Standing Status: Future     Number of Occurrences: 1     Standing Expiration Date: 07/20/2015    Order Specific Question:  Reason for Exam (SYMPTOM  OR DIAGNOSIS REQUIRED)    Answer:  chest pain    Order Specific Question:  Is the patient pregnant?    Answer:  No    Order Specific Question:  Preferred imaging location?    Answer:  Internal  . CMP14+EGFR  . NMR, lipoprofile  . POCT glycosylated hemoglobin (Hb A1C)  . EKG 12-Lead   Meds ordered this encounter  Medications  . furosemide (LASIX) 20 MG tablet    Sig: Take 1 tablet (20 mg total) by mouth daily.    Dispense:  90 tablet    Refill:  1    Order Specific Question:  Supervising Provider    Answer:  Chipper Herb [[id number]]  . ezetimibe (ZETIA) 10 MG tablet    Sig: Take 1 tablet (10 mg total) by mouth daily.    Dispense:  90 tablet    Refill:  1    Order  Specific Question:  Supervising Provider    Answer:  Chipper Herb [[id number]]  . atorvastatin (LIPITOR) 40 MG tablet    Sig: Take 1 tablet (40 mg total) by mouth daily.    Dispense:  90 tablet    Refill:  1    Order Specific Question:  Supervising Provider    Answer:  Chipper Herb [[id number]]  . amLODipine-olmesartan (AZOR) 5-40 MG per tablet    Sig: Take 1 tablet by mouth daily.    Dispense:  90 tablet    Refill:  1    Order Specific Question:  Supervising Provider    Answer:  Chipper Herb [[id number]]  . metFORMIN (GLUCOPHAGE) 500 MG tablet    Sig: Take 1 tablet (500 mg total) by mouth 2 (two) times daily with a meal.    Dispense:  180 tablet    Refill:  1    Order Specific Question:  Supervising Provider    Answer:  Chipper Herb [[id number]]      Labs pending Health maintenance reviewed Diet and exercise encouraged-low fat Continue all meds Follow up  In 3 month   [place], FNP

## 2014-05-21 LAB — NMR, LIPOPROFILE
CHOLESTEROL: 99 mg/dL — AB (ref 100–199)
HDL Cholesterol by NMR: 32 mg/dL — ABNORMAL LOW (ref >39)
HDL PARTICLE NUMBER: 31.5 umol/L (ref >=30.5)
LDL Particle Number: 629 nmol/L (ref <1000)
LDL SIZE: 20 nm (ref >20.5)
LDLC SERPL CALC-MCNC: 46 mg/dL (ref 0–99)
LP-IR Score: 71 — ABNORMAL HIGH (ref <=45)
SMALL LDL PARTICLE NUMBER: 447 nmol/L (ref <=527)
TRIGLYCERIDES BY NMR: 105 mg/dL (ref 0–149)

## 2014-05-21 LAB — CMP14+EGFR
A/G RATIO: 2 (ref 1.1–2.5)
ALBUMIN: 4.4 g/dL (ref 3.5–5.5)
ALT: 25 IU/L (ref 0–32)
AST: 17 IU/L (ref 0–40)
Alkaline Phosphatase: 91 IU/L (ref 39–117)
BILIRUBIN TOTAL: 0.6 mg/dL (ref 0.0–1.2)
BUN / CREAT RATIO: 18 (ref 9–23)
BUN: 11 mg/dL (ref 6–24)
CHLORIDE: 97 mmol/L (ref 97–108)
CO2: 27 mmol/L (ref 18–29)
Calcium: 9.4 mg/dL (ref 8.7–10.2)
Creatinine, Ser: 0.62 mg/dL (ref 0.57–1.00)
GFR calc non Af Amer: 103 mL/min/{1.73_m2} (ref >59)
GFR, EST AFRICAN AMERICAN: 118 mL/min/{1.73_m2} (ref >59)
Globulin, Total: 2.2 g/dL (ref 1.5–4.5)
Glucose: 167 mg/dL — ABNORMAL HIGH (ref 65–99)
Potassium: 4 mmol/L (ref 3.5–5.2)
Sodium: 140 mmol/L (ref 134–144)
Total Protein: 6.6 g/dL (ref 6.0–8.5)

## 2014-05-23 ENCOUNTER — Telehealth: Payer: Self-pay | Admitting: Family Medicine

## 2014-05-23 NOTE — Telephone Encounter (Signed)
Message copied by Azalee Course on Mon May 23, 2014  8:50 AM ------      Message from: Bennie Pierini      Created: Mon May 23, 2014  8:07 AM       Hgba1c discussed at appointment      Kidney and liver function stable      Cholesterol looks great      Continue current meds- low fat diet and exercise and recheck in 3 months       ------

## 2014-06-20 ENCOUNTER — Other Ambulatory Visit: Payer: Self-pay | Admitting: Nurse Practitioner

## 2014-06-24 ENCOUNTER — Other Ambulatory Visit: Payer: Self-pay | Admitting: Nurse Practitioner

## 2014-07-18 ENCOUNTER — Other Ambulatory Visit: Payer: Self-pay | Admitting: Nurse Practitioner

## 2014-07-31 ENCOUNTER — Other Ambulatory Visit: Payer: Self-pay | Admitting: Nurse Practitioner

## 2014-09-05 ENCOUNTER — Ambulatory Visit (INDEPENDENT_AMBULATORY_CARE_PROVIDER_SITE_OTHER): Payer: BC Managed Care – PPO

## 2014-09-05 ENCOUNTER — Ambulatory Visit (INDEPENDENT_AMBULATORY_CARE_PROVIDER_SITE_OTHER): Payer: BC Managed Care – PPO | Admitting: Family Medicine

## 2014-09-05 ENCOUNTER — Other Ambulatory Visit: Payer: Self-pay | Admitting: Family Medicine

## 2014-09-05 ENCOUNTER — Encounter: Payer: Self-pay | Admitting: Family Medicine

## 2014-09-05 VITALS — BP 125/72 | HR 74 | Temp 98.3°F | Ht 65.0 in | Wt 256.0 lb

## 2014-09-05 DIAGNOSIS — M79642 Pain in left hand: Secondary | ICD-10-CM

## 2014-09-05 MED ORDER — NAPROXEN 500 MG PO TABS: 500.0000 mg | ORAL_TABLET | Freq: Two times a day (BID) | ORAL | Status: DC

## 2014-09-05 NOTE — Progress Notes (Signed)
   Subjective:    Patient ID: Carla Mason, female    DOB: 1961/07/17, 53 y.o.   MRN: [id number]  HPI Patient had fall today and she has injured her left wrist and left hand.   Review of Systems C/o left wrist discomfort   No chest pain, SOB, HA, dizziness, vision change, N/V, diarrhea, constipation, dysuria, urinary urgency or frequency, myalgias, arthralgias or rash.  Objective:   Physical Exam  Vital signs noted  Well developed well nourished female.  HEENT - Head atraumatic Normocephalic                Eyes - PERRLA, Conjuctiva - clear Sclera- Clear EOMI                Ears - EAC's Wnl TM's Wnl Gross Hearing WNL                Nose - Nares patent                 Throat - oropharanx wnl Respiratory - Lungs CTA bilateral Cardiac - RRR S1 and S2 without murmur MS - TTP left thumb and left toe, no crepitus or deformity  Xray left wrist - no fracture Prelimnary reading by Angeline SlimWilliam Oxford,FNP     Assessment & Plan:  Hand pain, left - Plan: DG Hand Complete Left, naproxen (NAPROSYN) 500 MG tablet  Deatra CanterWilliam J Oxford FNP

## 2014-09-08 ENCOUNTER — Encounter: Payer: BC Managed Care – PPO | Admitting: Family Medicine

## 2014-09-08 ENCOUNTER — Telehealth: Payer: Self-pay | Admitting: Family Medicine

## 2014-09-08 NOTE — Telephone Encounter (Signed)
Stp advised that she can take tylenol or motrin otc for pain relief and to take it easy, she will continue to be sore for some while after a fall. Pt voiced understanding, will close encounter.

## 2014-09-25 ENCOUNTER — Other Ambulatory Visit: Payer: Self-pay | Admitting: Family Medicine

## 2014-10-21 ENCOUNTER — Ambulatory Visit (INDEPENDENT_AMBULATORY_CARE_PROVIDER_SITE_OTHER): Payer: BLUE CROSS/BLUE SHIELD | Admitting: Nurse Practitioner

## 2014-10-21 ENCOUNTER — Encounter: Payer: Self-pay | Admitting: Nurse Practitioner

## 2014-10-21 VITALS — BP 142/76 | HR 80 | Temp 96.6°F | Ht 65.0 in | Wt 256.0 lb

## 2014-10-21 DIAGNOSIS — E119 Type 2 diabetes mellitus without complications: Secondary | ICD-10-CM

## 2014-10-21 DIAGNOSIS — R609 Edema, unspecified: Secondary | ICD-10-CM

## 2014-10-21 DIAGNOSIS — E785 Hyperlipidemia, unspecified: Secondary | ICD-10-CM

## 2014-10-21 DIAGNOSIS — I10 Essential (primary) hypertension: Secondary | ICD-10-CM

## 2014-10-21 LAB — POCT UA - MICROALBUMIN: Microalbumin Ur, POC: NEGATIVE mg/L

## 2014-10-21 LAB — POCT GLYCOSYLATED HEMOGLOBIN (HGB A1C): Hemoglobin A1C: 5.9

## 2014-10-21 MED ORDER — EZETIMIBE 10 MG PO TABS: 10.0000 mg | ORAL_TABLET | Freq: Every day | ORAL | Status: DC

## 2014-10-21 MED ORDER — FUROSEMIDE 20 MG PO TABS: 20.0000 mg | ORAL_TABLET | Freq: Every day | ORAL | Status: DC

## 2014-10-21 MED ORDER — METFORMIN HCL 500 MG PO TABS: 500.0000 mg | ORAL_TABLET | Freq: Two times a day (BID) | ORAL | Status: DC

## 2014-10-21 MED ORDER — ATORVASTATIN CALCIUM 40 MG PO TABS: ORAL_TABLET | ORAL | Status: DC

## 2014-10-21 NOTE — Progress Notes (Signed)
Subjective:    Patient ID: Carla Mason, female    DOB: 1961-08-31, 54 y.o.   MRN: [id number]  Patient here today for follow up- doing well today without complaints.  Hypertension This is a chronic problem. The current episode started more than 1 year ago. The problem is unchanged. The problem is controlled. Pertinent negatives include no chest pain, headaches or palpitations. Risk factors for coronary artery disease include dyslipidemia, diabetes mellitus, obesity and post-menopausal state. Past treatments include angiotensin blockers. The current treatment provides moderate improvement. Compliance problems include diet and exercise.   Hyperlipidemia This is a chronic problem. The current episode started more than 1 year ago. The problem is uncontrolled. Recent lipid tests were reviewed and are variable. Pertinent negatives include no chest pain. Current antihyperlipidemic treatment includes statins. The current treatment provides moderate improvement of lipids. Compliance problems include adherence to diet and adherence to exercise.  Risk factors for coronary artery disease include dyslipidemia, hypertension and obesity.  Diabetes She presents for her follow-up diabetic visit. She has type 2 diabetes mellitus. Her disease course has been fluctuating. Pertinent negatives for hypoglycemia include no headaches. Pertinent negatives for diabetes include no chest pain, no polydipsia, no polyphagia, no polyuria and no visual change. Symptoms are stable. Risk factors for coronary artery disease include dyslipidemia, hypertension, post-menopausal, obesity and diabetes mellitus. Current diabetic treatment includes oral agent (monotherapy). Her weight is stable. She has not had a previous visit with a dietitian. She participates in exercise daily. There is no change in her home blood glucose trend. Her breakfast blood glucose is taken between 8-9 am. Her breakfast blood glucose range is generally 90-110 mg/dl.  Her overall blood glucose range is 90-110 mg/dl. An ACE inhibitor/angiotensin II receptor blocker is not being taken. She does not see a podiatrist.Eye exam is current.  peripheral edema Currently on lasix- working well - still has swelling when she is on her feet at work.   Review of Systems  Cardiovascular: Negative for chest pain and palpitations.  Endocrine: Negative for polydipsia, polyphagia and polyuria.  Neurological: Negative for headaches.  All other systems reviewed and are negative.      Objective:   Physical Exam  Constitutional: She is oriented to person, place, and time. She appears well-developed and well-nourished.  HENT:  Nose: Nose normal.  Mouth/Throat: Oropharynx is clear and moist.  Eyes: EOM are normal.  Neck: Trachea normal, normal range of motion and full passive range of motion without pain. Neck supple. No JVD present. Carotid bruit is not present. No thyromegaly present.  Cardiovascular: Normal rate, regular rhythm, normal heart sounds and intact distal pulses.  Exam reveals no gallop and no friction rub.   No murmur heard. Pulmonary/Chest: Effort normal and breath sounds normal.  Abdominal: Soft. Bowel sounds are normal. She exhibits no distension and no mass. There is no tenderness.  Musculoskeletal: Normal range of motion.  Lymphadenopathy:    She has no cervical adenopathy.  Neurological: She is alert and oriented to person, place, and time. She has normal reflexes.  Positive 3/4 monofilamnet bil feet  Skin: Skin is warm and dry.  Early callus formation bil feet  Psychiatric: She has a normal mood and affect. Her behavior is normal. Judgment and thought content normal.   BP 142/76 mmHg  Pulse 80  Temp(Src) 96.6 F (35.9 C) (Oral)  Ht '5\' 5"'  (1.651 m)  Wt 256 lb (116.121 kg)  BMI 42.60 kg/m2   Results for orders placed or performed in visit  on 10/21/14  POCT glycosylated hemoglobin (Hb A1C)  Result Value Ref Range   Hemoglobin A1C 5.9%        Assessment & Plan:  1. Essential hypertension, benign Do not add salt to diet - CMP14+EGFR  2. Hyperlipidemia Low fat diet - NMR, lipoprofile - ezetimibe (ZETIA) 10 MG tablet; Take 1 tablet (10 mg total) by mouth daily.  Dispense: 90 tablet; Refill: 1 - atorvastatin (LIPITOR) 40 MG tablet; TAKE 1 TABLET (40 MG TOTAL) BY MOUTH DAILY.  Dispense: 90 tablet; Refill: 1  3. Type 2 diabetes mellitus without complication Continue to watch carbs in diet - POCT glycosylated hemoglobin (Hb A1C) - POCT UA - Microalbumin - metFORMIN (GLUCOPHAGE) 500 MG tablet; Take 1 tablet (500 mg total) by mouth 2 (two) times daily with a meal.  Dispense: 180 tablet; Refill: 1  4. Edema Elevate when sitting - furosemide (LASIX) 20 MG tablet; Take 1 tablet (20 mg total) by mouth daily.  Dispense: 90 tablet; Refill: 1    Labs pending Health maintenance reviewed Diet and exercise encouraged Continue all meds Follow up  In 3 month   [place], FNP

## 2014-10-21 NOTE — Patient Instructions (Signed)

## 2014-10-22 LAB — NMR, LIPOPROFILE
Cholesterol: 93 mg/dL — ABNORMAL LOW (ref 100–199)
HDL CHOLESTEROL BY NMR: 28 mg/dL — AB (ref >39)
HDL Particle Number: 28.1 umol/L — ABNORMAL LOW (ref >=30.5)
LDL PARTICLE NUMBER: 527 nmol/L (ref <1000)
LDL Size: 19.7 nm (ref >20.5)
LDL-C: 26 mg/dL (ref 0–99)
LP-IR Score: 77 — ABNORMAL HIGH (ref <=45)
SMALL LDL PARTICLE NUMBER: 415 nmol/L (ref <=527)
Triglycerides by NMR: 195 mg/dL — ABNORMAL HIGH (ref 0–149)

## 2014-10-22 LAB — CMP14+EGFR
A/G RATIO: 1.9 (ref 1.1–2.5)
ALK PHOS: 95 IU/L (ref 39–117)
ALT: 25 IU/L (ref 0–32)
AST: 19 IU/L (ref 0–40)
Albumin: 4.1 g/dL (ref 3.5–5.5)
BILIRUBIN TOTAL: 0.7 mg/dL (ref 0.0–1.2)
BUN/Creatinine Ratio: 21 (ref 9–23)
BUN: 11 mg/dL (ref 6–24)
CALCIUM: 9 mg/dL (ref 8.7–10.2)
CO2: 26 mmol/L (ref 18–29)
Chloride: 99 mmol/L (ref 97–108)
Creatinine, Ser: 0.53 mg/dL — ABNORMAL LOW (ref 0.57–1.00)
GFR calc Af Amer: 124 mL/min/{1.73_m2} (ref >59)
GFR calc non Af Amer: 107 mL/min/{1.73_m2} (ref >59)
GLOBULIN, TOTAL: 2.2 g/dL (ref 1.5–4.5)
GLUCOSE: 268 mg/dL — AB (ref 65–99)
Potassium: 4.1 mmol/L (ref 3.5–5.2)
Sodium: 142 mmol/L (ref 134–144)
TOTAL PROTEIN: 6.3 g/dL (ref 6.0–8.5)

## 2014-10-26 ENCOUNTER — Encounter: Payer: Self-pay | Admitting: Nurse Practitioner

## 2014-10-26 ENCOUNTER — Telehealth: Payer: Self-pay

## 2014-10-26 NOTE — Telephone Encounter (Signed)
[facility] for lab results 

## 2014-12-02 ENCOUNTER — Telehealth: Payer: Self-pay | Admitting: Nurse Practitioner

## 2014-12-02 NOTE — Telephone Encounter (Signed)
Pt wants appt 

## 2014-12-02 NOTE — Telephone Encounter (Signed)
Patient states that she has had a vaginal itch since august and she has used monistat and then she tries the vaseline cream that you recommended. Appointment scheduled for tomorrow with Paulene FloorMary Martin, FNP.

## 2014-12-03 ENCOUNTER — Ambulatory Visit (INDEPENDENT_AMBULATORY_CARE_PROVIDER_SITE_OTHER): Payer: BLUE CROSS/BLUE SHIELD | Admitting: Nurse Practitioner

## 2014-12-03 VITALS — BP 133/73 | HR 71 | Temp 97.7°F | Ht 65.0 in | Wt 258.6 lb

## 2014-12-03 DIAGNOSIS — B3731 Acute candidiasis of vulva and vagina: Secondary | ICD-10-CM

## 2014-12-03 DIAGNOSIS — B373 Candidiasis of vulva and vagina: Secondary | ICD-10-CM | POA: Diagnosis not present

## 2014-12-03 DIAGNOSIS — N898 Other specified noninflammatory disorders of vagina: Secondary | ICD-10-CM | POA: Diagnosis not present

## 2014-12-03 DIAGNOSIS — N904 Leukoplakia of vulva: Secondary | ICD-10-CM

## 2014-12-03 LAB — POCT WET PREP (WET MOUNT)
KOH Wet Prep POC: POSITIVE
TRICHOMONAS WET PREP HPF POC: NEGATIVE

## 2014-12-03 MED ORDER — FLUCONAZOLE 150 MG PO TABS: 150.0000 mg | ORAL_TABLET | Freq: Once | ORAL | Status: DC

## 2014-12-03 MED ORDER — BETAMETHASONE DIPROPIONATE AUG 0.05 % EX LOTN: TOPICAL_LOTION | Freq: Two times a day (BID) | CUTANEOUS | Status: DC

## 2014-12-03 NOTE — Progress Notes (Signed)
   Subjective:    Patient ID: Carla Mason, female    DOB: 1961-09-20, 54 y.o.   MRN: [id number]  HPI Patient in c/o perineal itch- has tried monistat which has not helped- she said everytime she wipes it burns.    Review of Systems  Constitutional: Negative.   HENT: Negative.   Respiratory: Negative.   Cardiovascular: Negative.   Genitourinary: Negative.   Neurological: Negative.   Psychiatric/Behavioral: Negative.   All other systems reviewed and are negative.      Objective:   Physical Exam  Constitutional: She is oriented to person, place, and time. She appears well-developed and well-nourished.  Cardiovascular: Normal rate and normal heart sounds.   Pulmonary/Chest: Effort normal and breath sounds normal.  Genitourinary:  Perineal area white and thickened skin- small skin tag left perineal area- scant white vaginal discharge  Neurological: She is alert and oriented to person, place, and time.  Skin: Skin is warm.  Psychiatric: She has a normal mood and affect. Her behavior is normal. Judgment and thought content normal.   BP 133/73 mmHg  Pulse 71  Temp(Src) 97.7 F (36.5 C) (Oral)  Ht 5\' 5"  (1.651 m)  Wt 258 lb 9.6 oz (117.3 kg)  BMI 43.03 kg/m2   Results for orders placed or performed in visit on 12/03/14  POCT Wet Prep [facility] [place])  Result Value Ref Range   Source Wet Prep POC vaginal    WBC, Wet Prep HPF POC 10-15    Bacteria Wet Prep HPF POC many    Clue Cells Wet Prep HPF POC Moderate    Yeast Wet Prep HPF POC Moderate    KOH Wet Prep POC positive    Trichomonas Wet Prep HPF POC negative           Assessment & Plan:   1. Vaginal discharge   2. Lichen sclerosus of female genitalia   3. Vaginal candidiasis    Meds ordered this encounter  Medications  . betamethasone, augmented, (DIPROLENE) 0.05 % lotion    Sig: Apply topically 2 (two) times daily.    Dispense:  30 mL    Refill:  0    Order Specific Question:  Supervising Provider   Answer:  Ernestina PennaMOORE, DONALD W [[id number]]  . fluconazole (DIFLUCAN) 150 MG tablet    Sig: Take 1 tablet (150 mg total) by mouth once.    Dispense:  1 tablet    Refill:  0    Order Specific Question:  Supervising Provider    Answer:  Ernestina PennaMOORE, DONALD W [[id number]]   Avoid scrathing Cool compresses Dove soap RTO prn  Carla Daphine DeutscherMartin, FNP

## 2014-12-03 NOTE — Patient Instructions (Signed)
Lichen Sclerosus Lichen sclerosus is a skin problem. It can happen on any part of the body, but it commonly involves the anal or genital areas. Lichen sclerosus is not an infection or a fungus. Girls and women are more commonly affected than boys and men. CAUSES The cause is not known. It could be the result of an overactive immune system or a lack of certain hormones. Lichen sclerosus is not passed from one person to another (not contagious). SYMPTOMS Your skin may have:  Thin, wrinkled, white areas.  Thickened white areas.  Red and swollen patches.  Tears or cracks.  Bruising.  Blood blisters.  Severe itching. You may also have pain, itching, or burning with urination. Constipation is also common in people with lichen sclerosus. DIAGNOSIS Your caregiver will do a physical exam. Sometimes, a tissue sample (biopsy) may be sent for testing. TREATMENT Treatment may involve putting a thin layer of medicated cream (topical steroid) over the areas with lichen sclerosus. Use the cream only as directed by your caregiver.  HOME CARE INSTRUCTIONS  Only take over-the-counter or prescription medicines as directed by your caregiver.  Keep the vaginal area as clean and dry as possible. SEEK MEDICAL CARE IF: You develop increasing pain, swelling, or redness. Document Released: 01/30/2011 Document Revised: 12/02/2011 Document Reviewed: 01/30/2011 ExitCare Patient Information 2015 [employer]. This information is not intended to replace advice given to you by your health care provider. Make sure you discuss any questions you have with your health care provider.  

## 2015-02-03 ENCOUNTER — Ambulatory Visit (INDEPENDENT_AMBULATORY_CARE_PROVIDER_SITE_OTHER): Payer: BLUE CROSS/BLUE SHIELD | Admitting: Nurse Practitioner

## 2015-02-03 ENCOUNTER — Encounter: Payer: Self-pay | Admitting: Nurse Practitioner

## 2015-02-03 VITALS — BP 134/68 | HR 85 | Temp 97.2°F | Ht 65.0 in | Wt 259.0 lb

## 2015-02-03 DIAGNOSIS — I10 Essential (primary) hypertension: Secondary | ICD-10-CM

## 2015-02-03 DIAGNOSIS — R609 Edema, unspecified: Secondary | ICD-10-CM | POA: Diagnosis not present

## 2015-02-03 DIAGNOSIS — G629 Polyneuropathy, unspecified: Secondary | ICD-10-CM | POA: Diagnosis not present

## 2015-02-03 DIAGNOSIS — E559 Vitamin D deficiency, unspecified: Secondary | ICD-10-CM

## 2015-02-03 DIAGNOSIS — E1142 Type 2 diabetes mellitus with diabetic polyneuropathy: Secondary | ICD-10-CM | POA: Diagnosis not present

## 2015-02-03 DIAGNOSIS — E119 Type 2 diabetes mellitus without complications: Secondary | ICD-10-CM | POA: Diagnosis not present

## 2015-02-03 DIAGNOSIS — E785 Hyperlipidemia, unspecified: Secondary | ICD-10-CM

## 2015-02-03 LAB — POCT GLYCOSYLATED HEMOGLOBIN (HGB A1C): Hemoglobin A1C: 6.1

## 2015-02-03 MED ORDER — ATORVASTATIN CALCIUM 40 MG PO TABS: ORAL_TABLET | ORAL | Status: DC

## 2015-02-03 MED ORDER — METFORMIN HCL 500 MG PO TABS: 500.0000 mg | ORAL_TABLET | Freq: Two times a day (BID) | ORAL | Status: DC

## 2015-02-03 MED ORDER — EZETIMIBE 10 MG PO TABS: 10.0000 mg | ORAL_TABLET | Freq: Every day | ORAL | Status: DC

## 2015-02-03 MED ORDER — FUROSEMIDE 20 MG PO TABS: 20.0000 mg | ORAL_TABLET | Freq: Every day | ORAL | Status: DC

## 2015-02-03 MED ORDER — AMLODIPINE-OLMESARTAN 5-40 MG PO TABS: 1.0000 | ORAL_TABLET | Freq: Every day | ORAL | Status: DC

## 2015-02-03 NOTE — Progress Notes (Signed)
Subjective:    Patient ID: Carla Mason, female    DOB: 02-May-1961, 54 y.o.   MRN: [id number]  Patient here today for follow up- doing well today without complaints.  Hypertension This is a chronic problem. The current episode started more than 1 year ago. The problem is unchanged. The problem is controlled. Pertinent negatives include no chest pain, headaches or palpitations. Risk factors for coronary artery disease include dyslipidemia, diabetes mellitus, obesity and post-menopausal state. Past treatments include angiotensin blockers. The current treatment provides moderate improvement. Compliance problems include diet and exercise.   Hyperlipidemia This is a chronic problem. The current episode started more than 1 year ago. The problem is uncontrolled. Recent lipid tests were reviewed and are variable. Pertinent negatives include no chest pain. Current antihyperlipidemic treatment includes statins. The current treatment provides moderate improvement of lipids. Compliance problems include adherence to diet and adherence to exercise.  Risk factors for coronary artery disease include dyslipidemia, hypertension and obesity.  Diabetes She presents for her follow-up diabetic visit. She has type 2 diabetes mellitus. Her disease course has been fluctuating. Pertinent negatives for hypoglycemia include no headaches. Pertinent negatives for diabetes include no chest pain, no polydipsia, no polyphagia, no polyuria and no visual change. Symptoms are stable. Risk factors for coronary artery disease include dyslipidemia, hypertension, post-menopausal, obesity and diabetes mellitus. Current diabetic treatment includes oral agent (monotherapy). Her weight is stable. She has not had a previous visit with a dietitian. She participates in exercise daily. There is no change in her home blood glucose trend. Her breakfast blood glucose is taken between 8-9 am. Her breakfast blood glucose range is generally 90-110 mg/dl.  Her overall blood glucose range is 90-110 mg/dl. An ACE inhibitor/angiotensin II receptor blocker is not being taken. She does not see a podiatrist.Eye exam is current.  peripheral edema Currently on lasix- working well - still has swelling when she is on her feet at work. neuropathy Currently on no meds    Review of Systems  Constitutional: Negative.   HENT: Negative.   Respiratory: Negative.   Cardiovascular: Negative for chest pain and palpitations.  Endocrine: Negative for polydipsia, polyphagia and polyuria.  Genitourinary: Negative.   Neurological: Negative for headaches.  Psychiatric/Behavioral: Negative.   All other systems reviewed and are negative.       Objective:   Physical Exam  Constitutional: She is oriented to person, place, and time. She appears well-developed and well-nourished.  HENT:  Nose: Nose normal.  Mouth/Throat: Oropharynx is clear and moist.  Eyes: EOM are normal.  Neck: Trachea normal, normal range of motion and full passive range of motion without pain. Neck supple. No JVD present. Carotid bruit is not present. No thyromegaly present.  Cardiovascular: Normal rate, regular rhythm, normal heart sounds and intact distal pulses.  Exam reveals no gallop and no friction rub.   No murmur heard. Pulmonary/Chest: Effort normal and breath sounds normal.  Abdominal: Soft. Bowel sounds are normal. She exhibits no distension and no mass. There is no tenderness.  Musculoskeletal: Normal range of motion.  Lymphadenopathy:    She has no cervical adenopathy.  Neurological: She is alert and oriented to person, place, and time. She has normal reflexes.  Positive 3/4 monofilamnet bil feet  Skin: Skin is warm and dry.  Early callus formation bil feet  Psychiatric: She has a normal mood and affect. Her behavior is normal. Judgment and thought content normal.    BP 134/68 mmHg  Pulse 85  Temp(Src) 97.2 F (36.2  C) (Oral)  Ht '5\' 5"'  (1.651 m)  Wt 259 lb (117.482  kg)  BMI 43.10 kg/m2  Results for orders placed or performed in visit on 02/03/15  POCT glycosylated hemoglobin (Hb A1C)  Result Value Ref Range   Hemoglobin A1C 6.1         Assessment & Plan:   1. Essential hypertension, benign **do not add salt to diet* - CMP14+EGFR - amLODipine-olmesartan (AZOR) 5-40 MG per tablet; Take 1 tablet by mouth daily.  Dispense: 30 tablet; Refill: 0  2. Neuropathy Do not go bear footed  3. Vitamin D deficiency disease  4. Peripheral edema Elevate legs when sitting - furosemide (LASIX) 20 MG tablet; Take 1 tablet (20 mg total) by mouth daily.  Dispense: 90 tablet; Refill: 1  5. Hyperlipidemia Low fat diet - NMR, lipoprofile - ezetimibe (ZETIA) 10 MG tablet; Take 1 tablet (10 mg total) by mouth daily.  Dispense: 90 tablet; Refill: 1 - atorvastatin (LIPITOR) 40 MG tablet; TAKE 1 TABLET (40 MG TOTAL) BY MOUTH DAILY.  Dispense: 90 tablet; Refill: 1  6. Type 2 diabetes mellitus with diabetic polyneuropathy Continue to watch carbs - POCT glycosylated hemoglobin (Hb A1C) - metFORMIN (GLUCOPHAGE) 500 MG tablet; Take 1 tablet (500 mg total) by mouth 2 (two) times daily with a meal.  Dispense: 180 tablet; Refill: 1   7. Severe obesity (BMI >= 40) Discussed diet and exercise for person with BMI >25 Will recheck weight in 3-6 months    Labs pending Health maintenance reviewed Diet and exercise encouraged Continue all meds Follow up  In 3 months   [place], FNP

## 2015-02-03 NOTE — Patient Instructions (Signed)
Fat and Cholesterol Control Diet Fat and cholesterol levels in your blood and organs are influenced by your diet. High levels of fat and cholesterol may lead to diseases of the heart, small and large blood vessels, gallbladder, liver, and pancreas. CONTROLLING FAT AND CHOLESTEROL WITH DIET Although exercise and lifestyle factors are important, your diet is key. That is because certain foods are known to raise cholesterol and others to lower it. The goal is to balance foods for their effect on cholesterol and more importantly, to replace saturated and trans fat with other types of fat, such as monounsaturated fat, polyunsaturated fat, and omega-3 fatty acids. On average, a person should consume no more than 15 to 17 g of saturated fat daily. Saturated and trans fats are considered "bad" fats, and they will raise LDL cholesterol. Saturated fats are primarily found in animal products such as meats, butter, and cream. However, that does not mean you need to give up all your favorite foods. Today, there are good tasting, low-fat, low-cholesterol substitutes for most of the things you like to eat. Choose low-fat or nonfat alternatives. Choose round or loin cuts of red meat. These types of cuts are lowest in fat and cholesterol. Chicken (without the skin), fish, veal, and ground turkey breast are great choices. Eliminate fatty meats, such as hot dogs and salami. Even shellfish have little or no saturated fat. Have a 3 oz (85 g) portion when you eat lean meat, poultry, or fish. Trans fats are also called "partially hydrogenated oils." They are oils that have been scientifically manipulated so that they are solid at room temperature resulting in a longer shelf life and improved taste and texture of foods in which they are added. Trans fats are found in stick margarine, some tub margarines, cookies, crackers, and baked goods.  When baking and cooking, oils are a great substitute for butter. The monounsaturated oils are  especially beneficial since it is believed they lower LDL and raise HDL. The oils you should avoid entirely are saturated tropical oils, such as coconut and palm.  Remember to eat a lot from food groups that are naturally free of saturated and trans fat, including fish, fruit, vegetables, beans, grains (barley, rice, couscous, bulgur wheat), and pasta (without cream sauces).  IDENTIFYING FOODS THAT LOWER FAT AND CHOLESTEROL  Soluble fiber may lower your cholesterol. This type of fiber is found in fruits such as apples, vegetables such as broccoli, potatoes, and carrots, legumes such as beans, peas, and lentils, and grains such as barley. Foods fortified with plant sterols (phytosterol) may also lower cholesterol. You should eat at least 2 g per day of these foods for a cholesterol lowering effect.  Read package labels to identify low-saturated fats, trans fat free, and low-fat foods at the supermarket. Select cheeses that have only 2 to 3 g saturated fat per ounce. Use a heart-healthy tub margarine that is free of trans fats or partially hydrogenated oil. When buying baked goods (cookies, crackers), avoid partially hydrogenated oils. Breads and muffins should be made from whole grains (whole-wheat or whole oat flour, instead of "flour" or "enriched flour"). Buy non-creamy canned soups with reduced salt and no added fats.  FOOD PREPARATION TECHNIQUES  Never deep-fry. If you must fry, either stir-fry, which uses very little fat, or use non-stick cooking sprays. When possible, broil, bake, or roast meats, and steam vegetables. Instead of putting butter or margarine on vegetables, use lemon and herbs, applesauce, and cinnamon (for squash and sweet potatoes). Use nonfat   yogurt, salsa, and low-fat dressings for salads.  LOW-SATURATED FAT / LOW-FAT FOOD SUBSTITUTES Meats / Saturated Fat (g)  Avoid: Steak, marbled (3 oz/85 g) / 11 g  Choose: Steak, lean (3 oz/85 g) / 4 g  Avoid: Hamburger (3 oz/85 g) / 7  g  Choose: Hamburger, lean (3 oz/85 g) / 5 g  Avoid: Ham (3 oz/85 g) / 6 g  Choose: Ham, lean cut (3 oz/85 g) / 2.4 g  Avoid: Chicken, with skin, dark meat (3 oz/85 g) / 4 g  Choose: Chicken, skin removed, dark meat (3 oz/85 g) / 2 g  Avoid: Chicken, with skin, light meat (3 oz/85 g) / 2.5 g  Choose: Chicken, skin removed, light meat (3 oz/85 g) / 1 g Dairy / Saturated Fat (g)  Avoid: Whole milk (1 cup) / 5 g  Choose: Low-fat milk, 2% (1 cup) / 3 g  Choose: Low-fat milk, 1% (1 cup) / 1.5 g  Choose: Skim milk (1 cup) / 0.3 g  Avoid: Hard cheese (1 oz/28 g) / 6 g  Choose: Skim milk cheese (1 oz/28 g) / 2 to 3 g  Avoid: Cottage cheese, 4% fat (1 cup) / 6.5 g  Choose: Low-fat cottage cheese, 1% fat (1 cup) / 1.5 g  Avoid: Ice cream (1 cup) / 9 g  Choose: Sherbet (1 cup) / 2.5 g  Choose: Nonfat frozen yogurt (1 cup) / 0.3 g  Choose: Frozen fruit bar / trace  Avoid: Whipped cream (1 tbs) / 3.5 g  Choose: Nondairy whipped topping (1 tbs) / 1 g Condiments / Saturated Fat (g)  Avoid: Mayonnaise (1 tbs) / 2 g  Choose: Low-fat mayonnaise (1 tbs) / 1 g  Avoid: Butter (1 tbs) / 7 g  Choose: Extra light margarine (1 tbs) / 1 g  Avoid: Coconut oil (1 tbs) / 11.8 g  Choose: Olive oil (1 tbs) / 1.8 g  Choose: Corn oil (1 tbs) / 1.7 g  Choose: Safflower oil (1 tbs) / 1.2 g  Choose: Sunflower oil (1 tbs) / 1.4 g  Choose: Soybean oil (1 tbs) / 2.4 g  Choose: Canola oil (1 tbs) / 1 g Document Released: 09/09/2005 Document Revised: 01/04/2013 Document Reviewed: 12/08/2013 ExitCare Patient Information 2015 [employer]. This information is not intended to replace advice given to you by your health care provider. Make sure you discuss any questions you have with your health care provider.  

## 2015-02-04 ENCOUNTER — Other Ambulatory Visit: Payer: Self-pay | Admitting: Nurse Practitioner

## 2015-02-04 LAB — NMR, LIPOPROFILE
Cholesterol: 97 mg/dL — ABNORMAL LOW (ref 100–199)
HDL Cholesterol by NMR: 29 mg/dL — ABNORMAL LOW (ref >39)
HDL Particle Number: 30.2 umol/L — ABNORMAL LOW (ref >=30.5)
LDL Particle Number: 670 nmol/L (ref <1000)
LDL Size: 19.6 nm (ref >20.5)
LDL-C: 22 mg/dL (ref 0–99)
LP-IR SCORE: 71 — AB (ref <=45)
Small LDL Particle Number: 582 nmol/L — ABNORMAL HIGH (ref <=527)
TRIGLYCERIDES BY NMR: 231 mg/dL — AB (ref 0–149)

## 2015-02-04 LAB — CMP14+EGFR
ALK PHOS: 81 IU/L (ref 39–117)
ALT: 30 IU/L (ref 0–32)
AST: 23 IU/L (ref 0–40)
Albumin/Globulin Ratio: 1.8 (ref 1.1–2.5)
Albumin: 4.1 g/dL (ref 3.5–5.5)
BUN / CREAT RATIO: 21 (ref 9–23)
BUN: 11 mg/dL (ref 6–24)
Bilirubin Total: 0.7 mg/dL (ref 0.0–1.2)
CO2: 25 mmol/L (ref 18–29)
Calcium: 9.2 mg/dL (ref 8.7–10.2)
Chloride: 101 mmol/L (ref 97–108)
Creatinine, Ser: 0.52 mg/dL — ABNORMAL LOW (ref 0.57–1.00)
GFR calc Af Amer: 124 mL/min/{1.73_m2} (ref >59)
GFR calc non Af Amer: 108 mL/min/{1.73_m2} (ref >59)
GLOBULIN, TOTAL: 2.3 g/dL (ref 1.5–4.5)
GLUCOSE: 193 mg/dL — AB (ref 65–99)
POTASSIUM: 3.9 mmol/L (ref 3.5–5.2)
Sodium: 143 mmol/L (ref 134–144)
Total Protein: 6.4 g/dL (ref 6.0–8.5)

## 2015-03-01 ENCOUNTER — Other Ambulatory Visit: Payer: Self-pay | Admitting: Family Medicine

## 2015-03-03 LAB — HM DIABETES EYE EXAM

## 2015-04-10 ENCOUNTER — Other Ambulatory Visit: Payer: Self-pay | Admitting: Nurse Practitioner

## 2015-05-19 ENCOUNTER — Encounter: Payer: Self-pay | Admitting: Nurse Practitioner

## 2015-05-19 ENCOUNTER — Ambulatory Visit (INDEPENDENT_AMBULATORY_CARE_PROVIDER_SITE_OTHER): Payer: BLUE CROSS/BLUE SHIELD | Admitting: Nurse Practitioner

## 2015-05-19 VITALS — BP 131/78 | HR 79 | Temp 97.2°F | Ht 65.0 in | Wt 264.0 lb

## 2015-05-19 DIAGNOSIS — E1142 Type 2 diabetes mellitus with diabetic polyneuropathy: Secondary | ICD-10-CM | POA: Diagnosis not present

## 2015-05-19 DIAGNOSIS — E559 Vitamin D deficiency, unspecified: Secondary | ICD-10-CM

## 2015-05-19 DIAGNOSIS — R609 Edema, unspecified: Secondary | ICD-10-CM

## 2015-05-19 DIAGNOSIS — I10 Essential (primary) hypertension: Secondary | ICD-10-CM | POA: Diagnosis not present

## 2015-05-19 DIAGNOSIS — E785 Hyperlipidemia, unspecified: Secondary | ICD-10-CM

## 2015-05-19 LAB — POCT GLYCOSYLATED HEMOGLOBIN (HGB A1C): Hemoglobin A1C: 6.5

## 2015-05-19 MED ORDER — ATORVASTATIN CALCIUM 40 MG PO TABS: ORAL_TABLET | ORAL | Status: DC

## 2015-05-19 MED ORDER — EZETIMIBE 10 MG PO TABS: 10.0000 mg | ORAL_TABLET | Freq: Every day | ORAL | Status: DC

## 2015-05-19 MED ORDER — FUROSEMIDE 20 MG PO TABS: 20.0000 mg | ORAL_TABLET | Freq: Every day | ORAL | Status: DC

## 2015-05-19 MED ORDER — AMLODIPINE-OLMESARTAN 5-40 MG PO TABS: 1.0000 | ORAL_TABLET | Freq: Every day | ORAL | Status: DC

## 2015-05-19 MED ORDER — METFORMIN HCL 500 MG PO TABS: 500.0000 mg | ORAL_TABLET | Freq: Two times a day (BID) | ORAL | Status: DC

## 2015-05-19 NOTE — Progress Notes (Signed)
Subjective:    Patient ID: Carla Mason, female    DOB: 07-24-1961, 54 y.o.   MRN: [id number]  Patient here today for follow up- doing well today without complaints.  Hypertension This is a chronic problem. The current episode started more than 1 year ago. The problem is unchanged. The problem is controlled. Pertinent negatives include no chest pain, headaches or palpitations. Risk factors for coronary artery disease include dyslipidemia, diabetes mellitus, obesity and post-menopausal state. Past treatments include angiotensin blockers. The current treatment provides moderate improvement. Compliance problems include diet and exercise.   Hyperlipidemia This is a chronic problem. The current episode started more than 1 year ago. The problem is uncontrolled. Recent lipid tests were reviewed and are variable. Pertinent negatives include no chest pain. Current antihyperlipidemic treatment includes statins. The current treatment provides moderate improvement of lipids. Compliance problems include adherence to diet and adherence to exercise.  Risk factors for coronary artery disease include dyslipidemia, hypertension and obesity.  Diabetes She presents for her follow-up diabetic visit. She has type 2 diabetes mellitus. Her disease course has been fluctuating. Pertinent negatives for hypoglycemia include no headaches. Pertinent negatives for diabetes include no chest pain, no polydipsia, no polyphagia, no polyuria and no visual change. Symptoms are stable. Risk factors for coronary artery disease include dyslipidemia, hypertension, post-menopausal, obesity and diabetes mellitus. Current diabetic treatment includes oral agent (monotherapy). Her weight is stable. She has not had a previous visit with a dietitian. She participates in exercise daily. There is no change in her home blood glucose trend. Her breakfast blood glucose is taken between 8-9 am. Her breakfast blood glucose range is generally 90-110 mg/dl.  Her overall blood glucose range is 90-110 mg/dl. An ACE inhibitor/angiotensin II receptor blocker is not being taken. She does not see a podiatrist.Eye exam is current.  peripheral edema Currently on lasix- working well - still has swelling when she is on her feet at work. neuropathy Currently on no meds Vitamin d def Currently on vitamin d OTC daily  Review of Systems  Constitutional: Negative.   HENT: Negative.   Respiratory: Negative.   Cardiovascular: Negative for chest pain and palpitations.  Endocrine: Negative for polydipsia, polyphagia and polyuria.  Genitourinary: Negative.   Neurological: Negative for headaches.  Psychiatric/Behavioral: Negative.   All other systems reviewed and are negative.       Objective:   Physical Exam  Constitutional: She is oriented to person, place, and time. She appears well-developed and well-nourished.  HENT:  Nose: Nose normal.  Mouth/Throat: Oropharynx is clear and moist.  Eyes: EOM are normal.  Neck: Trachea normal, normal range of motion and full passive range of motion without pain. Neck supple. No JVD present. Carotid bruit is not present. No thyromegaly present.  Cardiovascular: Normal rate, regular rhythm, normal heart sounds and intact distal pulses.  Exam reveals no gallop and no friction rub.   No murmur heard. Pulmonary/Chest: Effort normal and breath sounds normal.  Abdominal: Soft. Bowel sounds are normal. She exhibits no distension and no mass. There is no tenderness.  Musculoskeletal: Normal range of motion.  Lymphadenopathy:    She has no cervical adenopathy.  Neurological: She is alert and oriented to person, place, and time. She has normal reflexes.  Positive 3/4 monofilamnet bil feet  Skin: Skin is warm and dry.  Early callus formation bil feet  Psychiatric: She has a normal mood and affect. Her behavior is normal. Judgment and thought content normal.    BP 131/78 mmHg  Pulse 79  Temp(Src) 97.2 F (36.2 C)  (Oral)  Ht '5\' 5"'  (1.651 m)  Wt 264 lb (119.75 kg)  BMI 43.93 kg/m2  Results for orders placed or performed in visit on 05/19/15  POCT glycosylated hemoglobin (Hb A1C)  Result Value Ref Range   Hemoglobin A1C 6.5            Assessment & Plan:   1. Essential hypertension, benign Do not add salt to diet - amLODipine-olmesartan (AZOR) 5-40 MG per tablet; Take 1 tablet by mouth daily.  Dispense: 90 tablet; Refill: 1 - CMP14+EGFR  2. Type 2 diabetes mellitus with diabetic polyneuropathy Continue to watch carbs in diet - POCT glycosylated hemoglobin (Hb A1C)  3. Hyperlipidemia Low fat diet - metFORMIN (GLUCOPHAGE) 500 MG tablet; Take 1 tablet (500 mg total) by mouth 2 (two) times daily with a meal.  Dispense: 180 tablet; Refill: 1 - atorvastatin (LIPITOR) 40 MG tablet; TAKE 1 TABLET (40 MG TOTAL) BY MOUTH DAILY.  Dispense: 90 tablet; Refill: 1 - ezetimibe (ZETIA) 10 MG tablet; Take 1 tablet (10 mg total) by mouth daily.  Dispense: 90 tablet; Refill: 1 - Lipid panel  4. Vitamin D deficiency disease  5. Peripheral edema Elevate when sitting - furosemide (LASIX) 20 MG tablet; Take 1 tablet (20 mg total) by mouth daily.  Dispense: 90 tablet; Refill: 1  6. Severe obesity (BMI >= 40) Discussed diet and exercise for person with BMI >25 Will recheck weight in 3-6 months     Labs pending Health maintenance reviewed Diet and exercise encouraged Continue all meds Follow up  In 3 months   [place], FNP

## 2015-05-19 NOTE — Patient Instructions (Signed)
Diabetes and Foot Care Diabetes may cause you to have problems because of poor blood supply (circulation) to your feet and legs. This may cause the skin on your feet to become thinner, break easier, and heal more slowly. Your skin may become dry, and the skin may peel and crack. You may also have nerve damage in your legs and feet causing decreased feeling in them. You may not notice minor injuries to your feet that could lead to infections or more serious problems. Taking care of your feet is one of the most important things you can do for yourself.  HOME CARE INSTRUCTIONS  Wear shoes at all times, even in the house. Do not go barefoot. Bare feet are easily injured.  Check your feet daily for blisters, cuts, and redness. If you cannot see the bottom of your feet, use a mirror or ask someone for help.  Wash your feet with warm water (do not use hot water) and mild soap. Then pat your feet and the areas between your toes until they are completely dry. Do not soak your feet as this can dry your skin.  Apply a moisturizing lotion or petroleum jelly (that does not contain alcohol and is unscented) to the skin on your feet and to dry, brittle toenails. Do not apply lotion between your toes.  Trim your toenails straight across. Do not dig under them or around the cuticle. File the edges of your nails with an emery board or nail file.  Do not cut corns or calluses or try to remove them with medicine.  Wear clean socks or stockings every day. Make sure they are not too tight. Do not wear knee-high stockings since they may decrease blood flow to your legs.  Wear shoes that fit properly and have enough cushioning. To break in new shoes, wear them for just a few hours a day. This prevents you from injuring your feet. Always look in your shoes before you put them on to be sure there are no objects inside.  Do not cross your legs. This may decrease the blood flow to your feet.  If you find a minor scrape,  cut, or break in the skin on your feet, keep it and the skin around it clean and dry. These areas may be cleansed with mild soap and water. Do not cleanse the area with peroxide, alcohol, or iodine.  When you remove an adhesive bandage, be sure not to damage the skin around it.  If you have a wound, look at it several times a day to make sure it is healing.  Do not use heating pads or hot water bottles. They may burn your skin. If you have lost feeling in your feet or legs, you may not know it is happening until it is too late.  Make sure your health care provider performs a complete foot exam at least annually or more often if you have foot problems. Report any cuts, sores, or bruises to your health care provider immediately. SEEK MEDICAL CARE IF:   You have an injury that is not healing.  You have cuts or breaks in the skin.  You have an ingrown nail.  You notice redness on your legs or feet.  You feel burning or tingling in your legs or feet.  You have pain or cramps in your legs and feet.  Your legs or feet are numb.  Your feet always feel cold. SEEK IMMEDIATE MEDICAL CARE IF:   There is increasing redness,   swelling, or pain in or around a wound.  There is a red line that goes up your leg.  Pus is coming from a wound.  You develop a fever or as directed by your health care provider.  You notice a bad smell coming from an ulcer or wound. Document Released: 09/06/2000 Document Revised: 05/12/2013 Document Reviewed: 02/16/2013 Exit[employer] Patient Information 2015 [employer]. This information is not intended to replace advice given to you by your health care provider. Make sure you discuss any questions you have with your health care provider.  

## 2015-05-20 LAB — LIPID PANEL
CHOL/HDL RATIO: 3 ratio (ref 0.0–4.4)
Cholesterol, Total: 97 mg/dL — ABNORMAL LOW (ref 100–199)
HDL: 32 mg/dL — ABNORMAL LOW (ref >39)
LDL Calculated: 36 mg/dL (ref 0–99)
Triglycerides: 143 mg/dL (ref 0–149)
VLDL Cholesterol Cal: 29 mg/dL (ref 5–40)

## 2015-05-20 LAB — CMP14+EGFR
A/G RATIO: 1.8 (ref 1.1–2.5)
ALBUMIN: 4.1 g/dL (ref 3.5–5.5)
ALT: 21 IU/L (ref 0–32)
AST: 14 IU/L (ref 0–40)
Alkaline Phosphatase: 87 IU/L (ref 39–117)
BUN/Creatinine Ratio: 24 — ABNORMAL HIGH (ref 9–23)
BUN: 12 mg/dL (ref 6–24)
Bilirubin Total: 0.6 mg/dL (ref 0.0–1.2)
CALCIUM: 9.2 mg/dL (ref 8.7–10.2)
CO2: 26 mmol/L (ref 18–29)
CREATININE: 0.49 mg/dL — AB (ref 0.57–1.00)
Chloride: 99 mmol/L (ref 97–108)
GFR, EST AFRICAN AMERICAN: 127 mL/min/{1.73_m2} (ref >59)
GFR, EST NON AFRICAN AMERICAN: 110 mL/min/{1.73_m2} (ref >59)
GLOBULIN, TOTAL: 2.3 g/dL (ref 1.5–4.5)
Glucose: 222 mg/dL — ABNORMAL HIGH (ref 65–99)
POTASSIUM: 4 mmol/L (ref 3.5–5.2)
SODIUM: 141 mmol/L (ref 134–144)
TOTAL PROTEIN: 6.4 g/dL (ref 6.0–8.5)

## 2015-07-10 ENCOUNTER — Other Ambulatory Visit: Payer: Self-pay | Admitting: Nurse Practitioner

## 2015-08-03 LAB — HM MAMMOGRAPHY

## 2015-08-14 ENCOUNTER — Encounter: Payer: Self-pay | Admitting: *Deleted

## 2015-08-25 ENCOUNTER — Ambulatory Visit (INDEPENDENT_AMBULATORY_CARE_PROVIDER_SITE_OTHER): Payer: BLUE CROSS/BLUE SHIELD | Admitting: Nurse Practitioner

## 2015-08-25 ENCOUNTER — Encounter: Payer: Self-pay | Admitting: Nurse Practitioner

## 2015-08-25 VITALS — BP 140/77 | HR 72 | Temp 98.0°F | Ht 65.0 in | Wt 260.0 lb

## 2015-08-25 DIAGNOSIS — Z1212 Encounter for screening for malignant neoplasm of rectum: Secondary | ICD-10-CM | POA: Diagnosis not present

## 2015-08-25 DIAGNOSIS — I10 Essential (primary) hypertension: Secondary | ICD-10-CM

## 2015-08-25 DIAGNOSIS — E1142 Type 2 diabetes mellitus with diabetic polyneuropathy: Secondary | ICD-10-CM

## 2015-08-25 DIAGNOSIS — E785 Hyperlipidemia, unspecified: Secondary | ICD-10-CM

## 2015-08-25 LAB — POCT GLYCOSYLATED HEMOGLOBIN (HGB A1C): Hemoglobin A1C: 6.1

## 2015-08-25 NOTE — Patient Instructions (Signed)
Health Maintenance, Female Adopting a healthy lifestyle and getting preventive care can go a long way to promote health and wellness. Talk with your health care provider about what schedule of regular examinations is right for you. This is a good chance for you to check in with your provider about disease prevention and staying healthy. In between checkups, there are plenty of things you can do on your own. Experts have done a lot of research about which lifestyle changes and preventive measures are most likely to keep you healthy. Ask your health care provider for more information. WEIGHT AND DIET  Eat a healthy diet  Be sure to include plenty of vegetables, fruits, low-fat dairy products, and lean protein.  Do not eat a lot of foods high in solid fats, added sugars, or salt.  Get regular exercise. This is one of the most important things you can do for your health.  Most adults should exercise for at least 150 minutes each week. The exercise should increase your heart rate and make you sweat (moderate-intensity exercise).  Most adults should also do strengthening exercises at least twice a week. This is in addition to the moderate-intensity exercise.  Maintain a healthy weight  Body mass index (BMI) is a measurement that can be used to identify possible weight problems. It estimates body fat based on height and weight. Your health care provider can help determine your BMI and help you achieve or maintain a healthy weight.  For females 20 years of age and older:   A BMI below 18.5 is considered underweight.  A BMI of 18.5 to 24.9 is normal.  A BMI of 25 to 29.9 is considered overweight.  A BMI of 30 and above is considered obese.  Watch levels of cholesterol and blood lipids  You should start having your blood tested for lipids and cholesterol at 56 years of age, then have this test every 5 years.  You may need to have your cholesterol levels checked more often if:  Your lipid  or cholesterol levels are high.  You are older than 56 years of age.  You are at high risk for heart disease.  CANCER SCREENING   Lung Cancer  Lung cancer screening is recommended for adults 55-80 years old who are at high risk for lung cancer because of a history of smoking.  A yearly low-dose CT scan of the lungs is recommended for people who:  Currently smoke.  Have quit within the past 15 years.  Have at least a 30-pack-year history of smoking. A pack year is smoking an average of one pack of cigarettes a day for 1 year.  Yearly screening should continue until it has been 15 years since you quit.  Yearly screening should stop if you develop a health problem that would prevent you from having lung cancer treatment.  Breast Cancer  Practice breast self-awareness. This means understanding how your breasts normally appear and feel.  It also means doing regular breast self-exams. Let your health care provider know about any changes, no matter how small.  If you are in your 20s or 30s, you should have a clinical breast exam (CBE) by a health care provider every 1-3 years as part of a regular health exam.  If you are 40 or older, have a CBE every year. Also consider having a breast X-ray (mammogram) every year.  If you have a family history of breast cancer, talk to your health care provider about genetic screening.  If you   are at high risk for breast cancer, talk to your health care provider about having an MRI and a mammogram every year.  Breast cancer gene (BRCA) assessment is recommended for women who have family members with BRCA-related cancers. BRCA-related cancers include:  Breast.  Ovarian.  Tubal.  Peritoneal cancers.  Results of the assessment will determine the need for genetic counseling and BRCA1 and BRCA2 testing. Cervical Cancer Your health care provider may recommend that you be screened regularly for cancer of the pelvic organs (ovaries, uterus, and  vagina). This screening involves a pelvic examination, including checking for microscopic changes to the surface of your cervix (Pap test). You may be encouraged to have this screening done every 3 years, beginning at age 21.  For women ages 30-65, health care providers may recommend pelvic exams and Pap testing every 3 years, or they may recommend the Pap and pelvic exam, combined with testing for human papilloma virus (HPV), every 5 years. Some types of HPV increase your risk of cervical cancer. Testing for HPV may also be done on women of any age with unclear Pap test results.  Other health care providers may not recommend any screening for nonpregnant women who are considered low risk for pelvic cancer and who do not have symptoms. Ask your health care provider if a screening pelvic exam is right for you.  If you have had past treatment for cervical cancer or a condition that could lead to cancer, you need Pap tests and screening for cancer for at least 20 years after your treatment. If Pap tests have been discontinued, your risk factors (such as having a new sexual partner) need to be reassessed to determine if screening should resume. Some women have medical problems that increase the chance of getting cervical cancer. In these cases, your health care provider may recommend more frequent screening and Pap tests. Colorectal Cancer  This type of cancer can be detected and often prevented.  Routine colorectal cancer screening usually begins at 56 years of age and continues through 56 years of age.  Your health care provider may recommend screening at an earlier age if you have risk factors for colon cancer.  Your health care provider may also recommend using home test kits to check for hidden blood in the stool.  A small camera at the end of a tube can be used to examine your colon directly (sigmoidoscopy or colonoscopy). This is done to check for the earliest forms of colorectal  cancer.  Routine screening usually begins at age 50.  Direct examination of the colon should be repeated every 5-10 years through 56 years of age. However, you may need to be screened more often if early forms of precancerous polyps or small growths are found. Skin Cancer  Check your skin from head to toe regularly.  Tell your health care provider about any new moles or changes in moles, especially if there is a change in a mole's shape or color.  Also tell your health care provider if you have a mole that is larger than the size of a pencil eraser.  Always use sunscreen. Apply sunscreen liberally and repeatedly throughout the day.  Protect yourself by wearing long sleeves, pants, a wide-brimmed hat, and sunglasses whenever you are outside. HEART DISEASE, DIABETES, AND HIGH BLOOD PRESSURE   High blood pressure causes heart disease and increases the risk of stroke. High blood pressure is more likely to develop in:  People who have blood pressure in the high end   of the normal range (130-139/85-89 mm Hg).  People who are overweight or obese.  People who are African American.  If you are 38-23 years of age, have your blood pressure checked every 3-5 years. If you are 61 years of age or older, have your blood pressure checked every year. You should have your blood pressure measured twice--once when you are at a hospital or clinic, and once when you are not at a hospital or clinic. Record the average of the two measurements. To check your blood pressure when you are not at a hospital or clinic, you can use:  An automated blood pressure machine at a pharmacy.  A home blood pressure monitor.  If you are between 45 years and 39 years old, ask your health care provider if you should take aspirin to prevent strokes.  Have regular diabetes screenings. This involves taking a blood sample to check your fasting blood sugar level.  If you are at a normal weight and have a low risk for diabetes,  have this test once every three years after 56 years of age.  If you are overweight and have a high risk for diabetes, consider being tested at a younger age or more often. PREVENTING INFECTION  Hepatitis B  If you have a higher risk for hepatitis B, you should be screened for this virus. You are considered at high risk for hepatitis B if:  You were born in a country where hepatitis B is common. Ask your health care provider which countries are considered high risk.  Your parents were born in a high-risk country, and you have not been immunized against hepatitis B (hepatitis B vaccine).  You have HIV or AIDS.  You use needles to inject street drugs.  You live with someone who has hepatitis B.  You have had sex with someone who has hepatitis B.  You get hemodialysis treatment.  You take certain medicines for conditions, including cancer, organ transplantation, and autoimmune conditions. Hepatitis C  Blood testing is recommended for:  Everyone born from [birth year] through 1967.  Anyone with known risk factors for hepatitis C. Sexually transmitted infections (STIs)  You should be screened for sexually transmitted infections (STIs) including gonorrhea and chlamydia if:  You are sexually active and are younger than 56 years of age.  You are older than 56 years of age and your health care provider tells you that you are at risk for this type of infection.  Your sexual activity has changed since you were last screened and you are at an increased risk for chlamydia or gonorrhea. Ask your health care provider if you are at risk.  If you do not have HIV, but are at risk, it may be recommended that you take a prescription medicine daily to prevent HIV infection. This is called pre-exposure prophylaxis (PrEP). You are considered at risk if:  You are sexually active and do not regularly use condoms or know the HIV status of your partner(s).  You take drugs by injection.  You are sexually  active with a partner who has HIV. Talk with your health care provider about whether you are at high risk of being infected with HIV. If you choose to begin PrEP, you should first be tested for HIV. You should then be tested every 3 months for as long as you are taking PrEP.  PREGNANCY   If you are premenopausal and you may become pregnant, ask your health care provider about preconception counseling.  If you may  become pregnant, take 400 to 800 micrograms (mcg) of folic acid every day.  If you want to prevent pregnancy, talk to your health care provider about birth control (contraception). OSTEOPOROSIS AND MENOPAUSE   Osteoporosis is a disease in which the bones lose minerals and strength with aging. This can result in serious bone fractures. Your risk for osteoporosis can be identified using a bone density scan.  If you are 61 years of age or older, or if you are at risk for osteoporosis and fractures, ask your health care provider if you should be screened.  Ask your health care provider whether you should take a calcium or vitamin D supplement to lower your risk for osteoporosis.  Menopause may have certain physical symptoms and risks.  Hormone replacement therapy may reduce some of these symptoms and risks. Talk to your health care provider about whether hormone replacement therapy is right for you.  HOME CARE INSTRUCTIONS   Schedule regular health, dental, and eye exams.  Stay current with your immunizations.   Do not use any tobacco products including cigarettes, chewing tobacco, or electronic cigarettes.  If you are pregnant, do not drink alcohol.  If you are breastfeeding, limit how much and how often you drink alcohol.  Limit alcohol intake to no more than 1 drink per day for nonpregnant women. One drink equals 12 ounces of beer, 5 ounces of wine, or 1 ounces of hard liquor.  Do not use street drugs.  Do not share needles.  Ask your health care provider for help if  you need support or information about quitting drugs.  Tell your health care provider if you often feel depressed.  Tell your health care provider if you have ever been abused or do not feel safe at home.   This information is not intended to replace advice given to you by your health care provider. Make sure you discuss any questions you have with your health care provider.   Document Released: 03/25/2011 Document Revised: 09/30/2014 Document Reviewed: 08/11/2013 Elsevier Interactive Patient Education [employer].

## 2015-08-25 NOTE — Progress Notes (Signed)
Subjective:    Patient ID: Carla Mason, female    DOB: 1961/04/09, 54 y.o.   MRN: [id number]  Patient here today for follow up- doing well today without complaints.  Hypertension This is a chronic problem. The current episode started more than 1 year ago. The problem is unchanged. The problem is controlled. Pertinent negatives include no chest pain, headaches or palpitations. Risk factors for coronary artery disease include dyslipidemia, diabetes mellitus, obesity and post-menopausal state. Past treatments include angiotensin blockers. The current treatment provides moderate improvement. Compliance problems include diet and exercise.   Hyperlipidemia This is a chronic problem. The current episode started more than 1 year ago. The problem is uncontrolled. Recent lipid tests were reviewed and are variable. Pertinent negatives include no chest pain. Current antihyperlipidemic treatment includes statins. The current treatment provides moderate improvement of lipids. Compliance problems include adherence to diet and adherence to exercise.  Risk factors for coronary artery disease include dyslipidemia, hypertension and obesity.  Diabetes She presents for her follow-up diabetic visit. She has type 2 diabetes mellitus. Her disease course has been fluctuating. Pertinent negatives for hypoglycemia include no headaches. Pertinent negatives for diabetes include no chest pain, no polydipsia, no polyphagia, no polyuria and no visual change. Symptoms are stable. Risk factors for coronary artery disease include dyslipidemia, hypertension, post-menopausal, obesity and diabetes mellitus. Current diabetic treatment includes oral agent (monotherapy). Her weight is stable. She has not had a previous visit with a dietitian. She participates in exercise daily. There is no change in her home blood glucose trend. Her breakfast blood glucose is taken between 8-9 am. Her breakfast blood glucose range is generally 90-110 mg/dl.  Her overall blood glucose range is 90-110 mg/dl. An ACE inhibitor/angiotensin II receptor blocker is not being taken. She does not see a podiatrist.Eye exam is current.  peripheral edema Currently on lasix- working well - still has swelling when she is on her feet at work. neuropathy Currently on no meds Vitamin d def Currently on vitamin d OTC daily Arthritis bil knees Patient has always had knee pain- getting worse- needs TKR but is afraid to have done.   Review of Systems  Constitutional: Negative.   HENT: Negative.   Respiratory: Negative.   Cardiovascular: Negative for chest pain and palpitations.  Endocrine: Negative for polydipsia, polyphagia and polyuria.  Genitourinary: Negative.   Neurological: Negative for headaches.  Psychiatric/Behavioral: Negative.   All other systems reviewed and are negative.       Objective:   Physical Exam  Constitutional: She is oriented to person, place, and time. She appears well-developed and well-nourished.  HENT:  Nose: Nose normal.  Mouth/Throat: Oropharynx is clear and moist.  Eyes: EOM are normal.  Neck: Trachea normal, normal range of motion and full passive range of motion without pain. Neck supple. No JVD present. Carotid bruit is not present. No thyromegaly present.  Cardiovascular: Normal rate, regular rhythm, normal heart sounds and intact distal pulses.  Exam reveals no gallop and no friction rub.   No murmur heard. Pulmonary/Chest: Effort normal and breath sounds normal.  Abdominal: Soft. Bowel sounds are normal. She exhibits no distension and no mass. There is no tenderness.  Musculoskeletal: Normal range of motion.  Lymphadenopathy:    She has no cervical adenopathy.  Neurological: She is alert and oriented to person, place, and time. She has normal reflexes.  Positive 3/4 monofilamnet bil feet  Skin: Skin is warm and dry.  Early callus formation bil feet  Psychiatric: She has a  normal mood and affect. Her behavior is  normal. Judgment and thought content normal.    BP 140/77 mmHg  Pulse 72  Temp(Src) 98 F (36.7 C) (Oral)  Ht '5\' 5"'  (1.651 m)  Wt 260 lb (117.935 kg)  BMI 43.27 kg/m2  Results for orders placed or performed in visit on 08/25/15  POCT glycosylated hemoglobin (Hb A1C)  Result Value Ref Range   Hemoglobin A1C 6.1            Assessment & Plan:   1. Essential hypertension, benign Do not add salt to diet - amLODipine-olmesartan (AZOR) 5-40 MG per tablet; Take 1 tablet by mouth daily.  Dispense: 90 tablet; Refill: 1 - CMP14+EGFR  2. Type 2 diabetes mellitus with diabetic polyneuropathy Continue to watch carbs in diet - POCT glycosylated hemoglobin (Hb A1C)  3. Hyperlipidemia Low fat diet - metFORMIN (GLUCOPHAGE) 500 MG tablet; Take 1 tablet (500 mg total) by mouth 2 (two) times daily with a meal.  Dispense: 180 tablet; Refill: 1 - atorvastatin (LIPITOR) 40 MG tablet; TAKE 1 TABLET (40 MG TOTAL) BY MOUTH DAILY.  Dispense: 90 tablet; Refill: 1 - ezetimibe (ZETIA) 10 MG tablet; Take 1 tablet (10 mg total) by mouth daily.  Dispense: 90 tablet; Refill: 1 - Lipid panel  4. Vitamin D deficiency disease  5. Peripheral edema Elevate when sitting - furosemide (LASIX) 20 MG tablet; Take 1 tablet (20 mg total) by mouth daily.  Dispense: 90 tablet; Refill: 1  6. Severe obesity (BMI >= 40) Discussed diet and exercise for person with BMI >25 Will recheck weight in 3-6 months  hemoccult cards given to patient with directions Labs pending Health maintenance reviewed Diet and exercise encouraged Continue all meds Follow up  In 3 months   [place], FNP

## 2015-08-26 LAB — CMP14+EGFR
A/G RATIO: 1.8 (ref 1.1–2.5)
ALK PHOS: 93 IU/L (ref 39–117)
ALT: 26 IU/L (ref 0–32)
AST: 16 IU/L (ref 0–40)
Albumin: 4.2 g/dL (ref 3.5–5.5)
BILIRUBIN TOTAL: 0.7 mg/dL (ref 0.0–1.2)
BUN / CREAT RATIO: 20 (ref 9–23)
BUN: 11 mg/dL (ref 6–24)
CO2: 27 mmol/L (ref 18–29)
CREATININE: 0.54 mg/dL — AB (ref 0.57–1.00)
Calcium: 9.2 mg/dL (ref 8.7–10.2)
Chloride: 98 mmol/L (ref 97–106)
GFR calc non Af Amer: 107 mL/min/{1.73_m2} (ref >59)
GFR, EST AFRICAN AMERICAN: 123 mL/min/{1.73_m2} (ref >59)
Globulin, Total: 2.4 g/dL (ref 1.5–4.5)
Glucose: 110 mg/dL — ABNORMAL HIGH (ref 65–99)
Potassium: 3.9 mmol/L (ref 3.5–5.2)
SODIUM: 138 mmol/L (ref 136–144)
TOTAL PROTEIN: 6.6 g/dL (ref 6.0–8.5)

## 2015-08-26 LAB — MICROALBUMIN / CREATININE URINE RATIO
CREATININE, UR: 79.2 mg/dL
MICROALB/CREAT RATIO: 4 mg/g{creat} (ref 0.0–30.0)
MICROALBUM., U, RANDOM: 3.2 ug/mL

## 2015-08-26 LAB — LIPID PANEL
CHOLESTEROL TOTAL: 101 mg/dL (ref 100–199)
Chol/HDL Ratio: 3.5 ratio units (ref 0.0–4.4)
HDL: 29 mg/dL — ABNORMAL LOW (ref >39)
LDL CALC: 39 mg/dL (ref 0–99)
Triglycerides: 165 mg/dL — ABNORMAL HIGH (ref 0–149)
VLDL CHOLESTEROL CAL: 33 mg/dL (ref 5–40)

## 2015-08-28 ENCOUNTER — Other Ambulatory Visit: Payer: BLUE CROSS/BLUE SHIELD

## 2015-08-28 DIAGNOSIS — Z1212 Encounter for screening for malignant neoplasm of rectum: Secondary | ICD-10-CM

## 2015-08-29 LAB — FECAL OCCULT BLOOD, IMMUNOCHEMICAL: Fecal Occult Bld: NEGATIVE

## 2015-08-30 NOTE — Progress Notes (Signed)
Patient aware.

## 2015-09-22 ENCOUNTER — Other Ambulatory Visit: Payer: Self-pay | Admitting: Nurse Practitioner

## 2015-11-10 ENCOUNTER — Encounter: Payer: Self-pay | Admitting: Family Medicine

## 2015-11-10 ENCOUNTER — Ambulatory Visit (INDEPENDENT_AMBULATORY_CARE_PROVIDER_SITE_OTHER): Payer: BLUE CROSS/BLUE SHIELD | Admitting: Family Medicine

## 2015-11-10 ENCOUNTER — Telehealth: Payer: Self-pay | Admitting: Nurse Practitioner

## 2015-11-10 VITALS — BP 154/73 | HR 61 | Temp 97.1°F | Ht 65.0 in | Wt 264.6 lb

## 2015-11-10 DIAGNOSIS — L03012 Cellulitis of left finger: Secondary | ICD-10-CM | POA: Diagnosis not present

## 2015-11-10 MED ORDER — MUPIROCIN 2 % EX OINT: 1.0000 "application " | TOPICAL_OINTMENT | Freq: Two times a day (BID) | CUTANEOUS | Status: DC

## 2015-11-10 MED ORDER — CEPHALEXIN 500 MG PO CAPS: 500.0000 mg | ORAL_CAPSULE | Freq: Three times a day (TID) | ORAL | Status: DC

## 2015-11-10 NOTE — Telephone Encounter (Signed)
[facility]

## 2015-11-10 NOTE — Progress Notes (Signed)
   HPI  Patient presents today with concern for toe infection.  Patient explains that last weekend she got her nails done, 3 days later she developed pain of her left fourth toenail. 2 days ago is when the pain started, since that time she's continued to have redness and pain at the medial nail edge. She denies fever, chills, sweats, or change in her glucose control.  She is breathing easily She is tolerating foods and fluids normal  PMH: Smoking status noted ROS: Per HPI  Objective: BP 154/73 mmHg  Pulse 61  Temp(Src) 97.1 F (36.2 C) (Oral)  Ht  (1.651 m)  Wt 264 lb 9.6 oz (120.022 kg)  BMI 44.03 kg/m2 Gen: NAD, alert, cooperative with exam HEENT: NCAT, EOMI, PERRL CV: RRR, good S1/S2, no murmur Resp: CTABL, no wheezes, non-labored Foot: Left fourth digit with erythema and mild lesion at the medial nail edge, tenderness to palpation, no fluctuance or induration, small opening on the tip Neuro: Alert and oriented, No gross deficits  Assessment and plan:  # Paronychia Treat aggressively considering diabetes Mupirocin twice daily with a sense Lites Follow-up if worsening or does not improve completely by 7 days   Meds ordered this encounter  Medications  . mupirocin ointment (BACTROBAN) 2 %    Sig: Place 1 application into the nose 2 (two) times daily.    Dispense:  22 g    Refill:  0  . cephALEXin (KEFLEX) 500 MG capsule    Sig: Take 1 capsule (500 mg total) by mouth 3 (three) times daily.    Dispense:  21 capsule    Refill:  0    Murtis Sink, MD [place] [facility] 11/10/2015, 8:57 AM

## 2015-11-10 NOTE — Patient Instructions (Signed)
Great to meet you!  Foot soaks twice daily Afterward, place the ointment and cover with a bandaid Use the antibiotics  Call if anything gets worse or doesn't get better.    Paronychia  Paronychia is an infection of the skin. It happens near a fingernail or toenail. It may cause pain and swelling around the nail. Usually, it is not serious and it clears up with treatment. HOME CARE  Soak the fingers or toes in warm water as told by your doctor. You may be told to do this for 20 minutes, 2-3 times a day.  Keep the area dry when you are not soaking it.  Take medicines only as told by your doctor.  If you were given an antibiotic medicine, finish all of it even if you start to feel better.  Keep the affected area clean.  Do not try to drain a fluid-filled bump yourself.  Wear rubber gloves when putting your hands in water.  Wear gloves if your hands might touch cleaners or chemicals.  Follow your doctor's instructions about:  Wound care.  Bandage (dressing) changes and removal. GET HELP IF:  Your symptoms get worse or do not improve.  You have a fever or chills.  You have redness spreading from the affected area.  You have more fluid, blood, or pus coming from the affected area.  Your finger or knuckle is swollen or is hard to move.   This information is not intended to replace advice given to you by your health care provider. Make sure you discuss any questions you have with your health care provider.   Document Released: 08/28/2009 Document Revised: 01/24/2015 Document Reviewed: 08/17/2014 Elsevier Interactive Patient Education [employer]

## 2015-11-13 ENCOUNTER — Encounter: Payer: Self-pay | Admitting: *Deleted

## 2015-11-14 ENCOUNTER — Other Ambulatory Visit: Payer: Self-pay | Admitting: Nurse Practitioner

## 2015-12-01 ENCOUNTER — Ambulatory Visit: Payer: BLUE CROSS/BLUE SHIELD | Admitting: Nurse Practitioner

## 2015-12-01 ENCOUNTER — Ambulatory Visit: Payer: BLUE CROSS/BLUE SHIELD | Admitting: Family Medicine

## 2015-12-12 ENCOUNTER — Ambulatory Visit: Payer: BLUE CROSS/BLUE SHIELD | Admitting: Family Medicine

## 2015-12-23 ENCOUNTER — Ambulatory Visit (INDEPENDENT_AMBULATORY_CARE_PROVIDER_SITE_OTHER): Payer: BLUE CROSS/BLUE SHIELD | Admitting: Family

## 2015-12-23 VITALS — BP 125/78 | HR 79 | Temp 99.3°F | Ht 65.0 in | Wt 263.0 lb

## 2015-12-23 DIAGNOSIS — R6889 Other general symptoms and signs: Secondary | ICD-10-CM

## 2015-12-23 DIAGNOSIS — J0100 Acute maxillary sinusitis, unspecified: Secondary | ICD-10-CM | POA: Diagnosis not present

## 2015-12-23 LAB — RAPID STREP SCREEN (MED CTR MEBANE ONLY): Strep Gp A Ag, IA W/Reflex: NEGATIVE

## 2015-12-23 LAB — CULTURE, GROUP A STREP

## 2015-12-23 LAB — VERITOR FLU A/B WAIVED
INFLUENZA A: NEGATIVE
INFLUENZA B: NEGATIVE

## 2015-12-23 MED ORDER — AMOXICILLIN-POT CLAVULANATE 875-125 MG PO TABS: 1.0000 | ORAL_TABLET | Freq: Two times a day (BID) | ORAL | Status: DC

## 2015-12-23 NOTE — Patient Instructions (Signed)
Sinusitis, Adult Sinusitis is redness, soreness, and inflammation of the paranasal sinuses. Paranasal sinuses are air pockets within the bones of your face. They are located beneath your eyes, in the middle of your forehead, and above your eyes. In healthy paranasal sinuses, mucus is able to drain out, and air is able to circulate through them by way of your nose. However, when your paranasal sinuses are inflamed, mucus and air can become trapped. This can allow bacteria and other germs to grow and cause infection. Sinusitis can develop quickly and last only a short time (acute) or continue over a long period (chronic). Sinusitis that lasts for more than 12 weeks is considered chronic. CAUSES Causes of sinusitis include:  Allergies.  Structural abnormalities, such as displacement of the cartilage that separates your nostrils (deviated septum), which can decrease the air flow through your nose and sinuses and affect sinus drainage.  Functional abnormalities, such as when the small hairs (cilia) that line your sinuses and help remove mucus do not work properly or are not present. SIGNS AND SYMPTOMS Symptoms of acute and chronic sinusitis are the same. The primary symptoms are pain and pressure around the affected sinuses. Other symptoms include:  Upper toothache.  Earache.  Headache.  Bad breath.  Decreased sense of smell and taste.  A cough, which worsens when you are lying flat.  Fatigue.  Fever.  Thick drainage from your nose, which often is green and may contain pus (purulent).  Swelling and warmth over the affected sinuses. DIAGNOSIS Your health care provider will perform a physical exam. During your exam, your health care provider may perform any of the following to help determine if you have acute sinusitis or chronic sinusitis:  Look in your nose for signs of abnormal growths in your nostrils (nasal polyps).  Tap over the affected sinus to check for signs of  infection.  View the inside of your sinuses using an imaging device that has a light attached (endoscope). If your health care provider suspects that you have chronic sinusitis, one or more of the following tests may be recommended:  Allergy tests.  Nasal culture. A sample of mucus is taken from your nose, sent to a lab, and screened for bacteria.  Nasal cytology. A sample of mucus is taken from your nose and examined by your health care provider to determine if your sinusitis is related to an allergy. TREATMENT Most cases of acute sinusitis are related to a viral infection and will resolve on their own within 10 days. Sometimes, medicines are prescribed to help relieve symptoms of both acute and chronic sinusitis. These may include pain medicines, decongestants, nasal steroid sprays, or saline sprays. However, for sinusitis related to a bacterial infection, your health care provider will prescribe antibiotic medicines. These are medicines that will help kill the bacteria causing the infection. Rarely, sinusitis is caused by a fungal infection. In these cases, your health care provider will prescribe antifungal medicine. For some cases of chronic sinusitis, surgery is needed. Generally, these are cases in which sinusitis recurs more than 3 times per year, despite other treatments. HOME CARE INSTRUCTIONS  Drink plenty of water. Water helps thin the mucus so your sinuses can drain more easily.  Use a humidifier.  Inhale steam 3-4 times a day (for example, sit in the bathroom with the shower running).  Apply a warm, moist washcloth to your face 3-4 times a day, or as directed by your health care provider.  Use saline nasal sprays to help   moisten and clean your sinuses.  Take medicines only as directed by your health care provider.  If you were prescribed either an antibiotic or antifungal medicine, finish it all even if you start to feel better. SEEK IMMEDIATE MEDICAL CARE IF:  You have  increasing pain or severe headaches.  You have nausea, vomiting, or drowsiness.  You have swelling around your face.  You have vision problems.  You have a stiff neck.  You have difficulty breathing.   This information is not intended to replace advice given to you by your health care provider. Make sure you discuss any questions you have with your health care provider.   Document Released: 09/09/2005 Document Revised: 09/30/2014 Document Reviewed: 09/24/2011 Elsevier Interactive Patient Education 2016 [employer]  - Take meds as prescribed - Use a cool mist humidifier  -Use saline nose sprays frequently -Saline irrigations of the nose can be very helpful if done frequently.  * 4X daily for 1 week*  * Use of a nettie pot can be helpful with this. Follow directions with this* -Force fluids -For any cough or congestion  Use plain Mucinex- regular strength or max strength is fine   * Children- consult with Pharmacist for dosing -For fever or aces or pains- take tylenol or ibuprofen appropriate for age and weight.  * for fevers greater than 101 orally you may alternate ibuprofen and tylenol every  3 hours. -Throat lozenges if help   Deana Krock, FNP   

## 2015-12-23 NOTE — Progress Notes (Signed)
   Subjective:    Patient ID: Carla Mason, female    DOB: Jan 27, 1961, 55 y.o.   MRN: [id number]  Sore Throat  Associated symptoms include coughing, headaches and a hoarse voice. Pertinent negatives include no ear pain or shortness of breath.  Sinus Problem This is a new problem. The current episode started in the past 7 days. The problem has been gradually worsening since onset. Maximum temperature: 99. She is experiencing no pain. Associated symptoms include coughing, headaches, a hoarse voice, sneezing and a sore throat. Pertinent negatives include no chills, ear pain, shortness of breath or sinus pressure. Past treatments include oral decongestants. The treatment provided mild relief.      Review of Systems  Constitutional: Negative.  Negative for chills.  HENT: Positive for hoarse voice, sneezing and sore throat. Negative for ear pain and sinus pressure.   Eyes: Negative.   Respiratory: Positive for cough. Negative for shortness of breath.   Cardiovascular: Negative.  Negative for palpitations.  Gastrointestinal: Negative.   Endocrine: Negative.   Genitourinary: Negative.   Musculoskeletal: Negative.   Neurological: Positive for headaches.  Hematological: Negative.   Psychiatric/Behavioral: Negative.   All other systems reviewed and are negative.      Objective:   Physical Exam  Constitutional: She is oriented to person, place, and time. She appears well-developed and well-nourished. No distress.  HENT:  Head: Normocephalic and atraumatic.  Right Ear: External ear normal.  Left Ear: External ear normal.  Nasal passage erythemas with mild swelling  Oropharynx erythemas  Eyes: Pupils are equal, round, and reactive to light.  Neck: Normal range of motion. Neck supple. No thyromegaly present.  Cardiovascular: Normal rate, regular rhythm, normal heart sounds and intact distal pulses.   No murmur heard. Pulmonary/Chest: Effort normal and breath sounds normal. No  respiratory distress. She has no wheezes.  Abdominal: Soft. Bowel sounds are normal. She exhibits no distension. There is no tenderness.  Musculoskeletal: Normal range of motion. She exhibits no edema or tenderness.  Neurological: She is alert and oriented to person, place, and time. She has normal reflexes. No cranial nerve deficit.  Skin: Skin is warm and dry.  Psychiatric: She has a normal mood and affect. Her behavior is normal. Judgment and thought content normal.  Vitals reviewed.     BP 125/78 mmHg  Pulse 79  Temp(Src) 99.3 F (37.4 C) (Oral)  Ht 5\' 5"  (1.651 m)  Wt 263 lb (119.296 kg)  BMI 43.77 kg/m2     Assessment & Plan:  1. Flu-like symptoms - [facility] - Veritor Flu A/B Waived  2. Acute maxillary sinusitis, recurrence not specified -- Take meds as prescribed - Use a cool mist humidifier  -Use saline nose sprays frequently -Saline irrigations of the nose can be very helpful if done frequently.  * 4X daily for 1 week*  * Use of a nettie pot can be helpful with this. Follow directions with this* -Force fluids -For any cough or congestion  Use plain Mucinex- regular strength or max strength is fine   * Children- consult with Pharmacist for dosing -For fever or aces or pains- take tylenol or ibuprofen appropriate for age and weight.  * for fevers greater than 101 orally you may alternate ibuprofen and tylenol every  3 hours. -Throat lozenges if help - amoxicillin-clavulanate (AUGMENTIN) 875-125 MG tablet; Take 1 tablet by mouth 2 (two) times daily.  Dispense: 14 tablet; Refill: 0  Jannifer Rodneyhristy Hawks, FNP

## 2016-01-12 ENCOUNTER — Ambulatory Visit (INDEPENDENT_AMBULATORY_CARE_PROVIDER_SITE_OTHER): Payer: BLUE CROSS/BLUE SHIELD | Admitting: Family Medicine

## 2016-01-12 ENCOUNTER — Encounter: Payer: Self-pay | Admitting: Family Medicine

## 2016-01-12 VITALS — BP 136/81 | HR 92 | Temp 98.3°F | Ht 65.0 in | Wt 268.8 lb

## 2016-01-12 DIAGNOSIS — L301 Dyshidrosis [pompholyx]: Secondary | ICD-10-CM | POA: Diagnosis not present

## 2016-01-12 DIAGNOSIS — L989 Disorder of the skin and subcutaneous tissue, unspecified: Secondary | ICD-10-CM | POA: Diagnosis not present

## 2016-01-12 DIAGNOSIS — E785 Hyperlipidemia, unspecified: Secondary | ICD-10-CM | POA: Diagnosis not present

## 2016-01-12 DIAGNOSIS — E1142 Type 2 diabetes mellitus with diabetic polyneuropathy: Secondary | ICD-10-CM | POA: Diagnosis not present

## 2016-01-12 DIAGNOSIS — L821 Other seborrheic keratosis: Secondary | ICD-10-CM | POA: Diagnosis not present

## 2016-01-12 DIAGNOSIS — I10 Essential (primary) hypertension: Secondary | ICD-10-CM

## 2016-01-12 LAB — BAYER DCA HB A1C WAIVED: HB A1C (BAYER DCA - WAIVED): 6.6 % (ref <7.0)

## 2016-01-12 NOTE — Patient Instructions (Signed)
Great to see you!  Stop zetia (ezetemibe) and we will recheck your cholesterol in July or August

## 2016-01-12 NOTE — Progress Notes (Signed)
   HPI  Patient presents today here for follow-up diabetes and a few other problems.  Type 2 diabetes Gated metformin compliance Average fasting blood sugars 110 to 1:30 Watches her diet moderately. Her diet has slipped a little bit recently  Rash, skin lesion Left dorsal hand with a small black skin lesion that seems to gotten darker over the last 6 months No recent changes Right leg lesion that's been stable for several years Neither lesion is itchy, painful, or changing rapidly  Hypertension Good medication compliance No chest pain, dyspnea, palpitations Leg edema is improved with compression stockings.  Hyperlipidemia We like to stop Zetia Previously Had statin-induced muscle aches, after restarting she has not had a problem.    PMH: Smoking status noted ROS: Per HPI  Objective: BP 136/81 mmHg  Pulse 92  Temp(Src) 98.3 F (36.8 C) (Oral)  Ht 5\' 5"  (1.651 m)  Wt 268 lb 12.8 oz (121.927 kg)  BMI 44.73 kg/m2 Gen: NAD, alert, cooperative with exam HEENT: NCAT CV: RRR, good S1/S2, no murmur Resp: CTABL, no wheezes, non-labored Ext: No edema, warm Neuro: Alert and oriented, No gross deficits  Skin Black approximately 2-3 mm in diameter circular lesion on the dorsal left hand with sharp margins and symmetry Right lateral thigh with flesh-colored stuck on appearance lesion Also on right lateral thigh is approximately 1-1/2 cm in diameter roughly circular scaly lesion with erythematous border  Assessment and plan:  # Type 2 diabetes Control slipping slightly Continue current medications Discussed therapeutic lifestyle changes  # Hypertension Good control today Good medication compliance No changes, ARB, calcium channel blocker  # Hyperlipidemia Stopping Zetia, repeat lipids in 3 months Continue Lipitor  # Skin lesion Black skin lesion at least moderately concerning for melanoma given recent change Offered biopsy, also offered dermatology which she  prefers  Seborrheic keratosis Right thigh lesion consistent with seborrheic keratosis, discussed how this is benign  Dyshidrotic eczema Erythematous scaly rash on right lateral thigh likely eczematous Hydrocortisone cream recommended    Orders Placed This Encounter  Procedures  . Bayer DCA Hb A1c Waived  . Ambulatory referral to Dermatology    Referral Priority:  Routine    Referral Type:  Consultation    Referral Reason:  Specialty Services Required    Requested Specialty:  Dermatology    Number of Visits Requested:  1     Murtis SinkSam Kensington Duerst, MD [facility] 01/12/2016, 2:24 PM

## 2016-01-26 ENCOUNTER — Ambulatory Visit (INDEPENDENT_AMBULATORY_CARE_PROVIDER_SITE_OTHER): Payer: BLUE CROSS/BLUE SHIELD | Admitting: Family Medicine

## 2016-01-26 ENCOUNTER — Encounter: Payer: Self-pay | Admitting: Family Medicine

## 2016-01-26 VITALS — BP 138/78 | HR 84 | Temp 97.3°F | Ht 65.0 in | Wt 266.6 lb

## 2016-01-26 DIAGNOSIS — L03012 Cellulitis of left finger: Secondary | ICD-10-CM

## 2016-01-26 DIAGNOSIS — L600 Ingrowing nail: Secondary | ICD-10-CM | POA: Diagnosis not present

## 2016-01-26 MED ORDER — CEPHALEXIN 500 MG PO CAPS: 500.0000 mg | ORAL_CAPSULE | Freq: Three times a day (TID) | ORAL | Status: DC

## 2016-01-26 MED ORDER — MUPIROCIN 2 % EX OINT: 1.0000 "application " | TOPICAL_OINTMENT | Freq: Two times a day (BID) | CUTANEOUS | Status: DC

## 2016-01-26 NOTE — Patient Instructions (Signed)
Great to see you!  Try the mupirocin twice daily, do epsom soaks Take all of the antibiotics  Please let me know if you have any worsening.   I have sent a referral for Dr. Ulice Brilliantrake

## 2016-01-26 NOTE — Progress Notes (Signed)
   HPI  Patient presents today for for concern of toenail infection.  Patient explains that her last 2 days she's developed pain and erythema at the toenail fold on her left great toe.  She's very concerned because she has diabetes and she does not want to get out of hand. She states she she's concerned because her toenails seem to be growing inward, she's never had a problem with ingrown toenails.  She has no fever, chills, spreading erythema up the foot, or other symptoms  She feels well otherwise.  PMH: Smoking status noted ROS: Per HPI  Objective: BP 138/78 mmHg  Pulse 84  Temp(Src) 97.3 F (36.3 C) (Oral)  Ht 5\' 5"  (1.651 m)  Wt 266 lb 9.6 oz (120.929 kg)  BMI 44.36 kg/m2 Gen: NAD, alert, cooperative with exam HEENT: NCAT CV: RRR, good S1/S2, no murmur Resp: CTABL, no wheezes, non-labored  Ext: No edema, warm Neuro: Alert and oriented, No gross deficits  Skin Left great toe with lateral nail fold with erythema, swelling, and tenderness to palpation, no drainage or induration  Assessment and plan:  # Paronychia Early developing. Again, also ingrown toenail developing She has several questions about this. Given that she has diabetes I referred her to podiatry. Keflex and mupirocin along with supportive care for the paronychia Return to clinic with any worsening, failure to improve, or other concerns      Orders Placed This Encounter  Procedures  . Ambulatory referral to Podiatry    Referral Priority:  Routine    Referral Type:  Consultation    Referral Reason:  Specialty Services Required    Requested Specialty:  Podiatry    Number of Visits Requested:  1    Meds ordered this encounter  Medications  . cephALEXin (KEFLEX) 500 MG capsule    Sig: Take 1 capsule (500 mg total) by mouth 3 (three) times daily.    Dispense:  21 capsule    Refill:  0  . mupirocin ointment (BACTROBAN) 2 %    Sig: Place 1 application into the nose 2 (two) times daily.   Dispense:  22 g    Refill:  0    Murtis SinkSam Wasif Simonich, MD [facility] 01/26/2016, 6:13 PM

## 2016-02-13 ENCOUNTER — Other Ambulatory Visit: Payer: Self-pay | Admitting: Nurse Practitioner

## 2016-05-03 ENCOUNTER — Encounter: Payer: Self-pay | Admitting: Family Medicine

## 2016-05-03 ENCOUNTER — Ambulatory Visit (INDEPENDENT_AMBULATORY_CARE_PROVIDER_SITE_OTHER): Payer: BLUE CROSS/BLUE SHIELD | Admitting: Family Medicine

## 2016-05-03 VITALS — BP 152/75 | HR 60 | Temp 97.1°F | Ht 65.0 in | Wt 271.2 lb

## 2016-05-03 DIAGNOSIS — I10 Essential (primary) hypertension: Secondary | ICD-10-CM

## 2016-05-03 DIAGNOSIS — Z0000 Encounter for general adult medical examination without abnormal findings: Secondary | ICD-10-CM

## 2016-05-03 DIAGNOSIS — E785 Hyperlipidemia, unspecified: Secondary | ICD-10-CM

## 2016-05-03 DIAGNOSIS — E1142 Type 2 diabetes mellitus with diabetic polyneuropathy: Secondary | ICD-10-CM | POA: Diagnosis not present

## 2016-05-03 LAB — BAYER DCA HB A1C WAIVED: HB A1C: 6.8 % (ref <7.0)

## 2016-05-03 NOTE — Patient Instructions (Addendum)
Great to see you!  Lets see you again in 3 month sto discuss diabetes  We will call with labs within 1 week

## 2016-05-03 NOTE — Progress Notes (Signed)
   HPI  Patient presents today here for follow-up hypertension, diabetes, hyperlipidemia.  Diabetes She's not been watching her diet very carefully lately Good medication compliance No complaints about side effects.  Hypertension Average at home is 996 to 895 systolic over 70Y diastolic. No headache, chest pain, dyspnea, palpitations Mild leg edema helped by Lasix and compression stockings.  Hyperlipidemia Again not watching her diet very well, good medication compliance with Lipitor. Stop his last visit.  PMH: Smoking status noted ROS: Per HPI  Objective: BP (!) 152/75   Pulse 60   Temp 97.1 F (36.2 C) (Oral)   Ht _0  (1.651 m)   Wt 271 lb 3.2 oz (123 kg)   BMI 45.13 kg/m  Gen: NAD, alert, cooperative with exam HEENT: NCAT CV: RRR, good S1/S2, no murmur Resp: CTABL, no wheezes, non-labored Ext: 1+ pitting edema to the midcalf bilaterally, symmetric Neuro: Alert and oriented, No gross deficits  Assessment and plan:  # Hypertension Reasonably well-controlled today Continue current medications including amlodipine and olmesartan. Labs  # Type 2 diabetes A1c controlled at 6.8 Discussed stricter diet control and discussed looking for an exercise that she enjoys. Return to clinic in 3 months.  # Hyperlipidemia Repeating labs today, last visit, 3 months ago, we stopped ezetimibe. She has continued Lipitor without problem.  HCM Hep C   Orders Placed This Encounter  Procedures  . Bayer DCA Hb A1c Waived  . CMP14+EGFR  . Lipid panel  . CBC with Differential     Laroy Apple, MD [facility] 05/03/2016, 10:02 AM

## 2016-05-04 LAB — CMP14+EGFR
A/G RATIO: 1.7 (ref 1.2–2.2)
ALBUMIN: 4.2 g/dL (ref 3.5–5.5)
ALT: 21 IU/L (ref 0–32)
AST: 15 IU/L (ref 0–40)
Alkaline Phosphatase: 100 IU/L (ref 39–117)
BILIRUBIN TOTAL: 0.7 mg/dL (ref 0.0–1.2)
BUN / CREAT RATIO: 20 (ref 9–23)
BUN: 10 mg/dL (ref 6–24)
CALCIUM: 9.1 mg/dL (ref 8.7–10.2)
CO2: 27 mmol/L (ref 18–29)
Chloride: 99 mmol/L (ref 96–106)
Creatinine, Ser: 0.51 mg/dL — ABNORMAL LOW (ref 0.57–1.00)
GFR calc non Af Amer: 108 mL/min/{1.73_m2} (ref >59)
GFR, EST AFRICAN AMERICAN: 124 mL/min/{1.73_m2} (ref >59)
Globulin, Total: 2.5 g/dL (ref 1.5–4.5)
Glucose: 135 mg/dL — ABNORMAL HIGH (ref 65–99)
POTASSIUM: 3.9 mmol/L (ref 3.5–5.2)
SODIUM: 141 mmol/L (ref 134–144)
Total Protein: 6.7 g/dL (ref 6.0–8.5)

## 2016-05-04 LAB — LIPID PANEL
Chol/HDL Ratio: 3.4 ratio units (ref 0.0–4.4)
Cholesterol, Total: 119 mg/dL (ref 100–199)
HDL: 35 mg/dL — ABNORMAL LOW (ref >39)
LDL Calculated: 46 mg/dL (ref 0–99)
Triglycerides: 192 mg/dL — ABNORMAL HIGH (ref 0–149)
VLDL Cholesterol Cal: 38 mg/dL (ref 5–40)

## 2016-05-04 LAB — CBC WITH DIFFERENTIAL/PLATELET
BASOS ABS: 0.1 10*3/uL (ref 0.0–0.2)
Basos: 1 %
EOS (ABSOLUTE): 0.4 10*3/uL (ref 0.0–0.4)
EOS: 4 %
HEMATOCRIT: 40.5 % (ref 34.0–46.6)
HEMOGLOBIN: 12.8 g/dL (ref 11.1–15.9)
IMMATURE GRANULOCYTES: 0 %
Immature Grans (Abs): 0 10*3/uL (ref 0.0–0.1)
LYMPHS: 22 %
Lymphocytes Absolute: 2.1 10*3/uL (ref 0.7–3.1)
MCH: 25.7 pg — ABNORMAL LOW (ref 26.6–33.0)
MCHC: 31.6 g/dL (ref 31.5–35.7)
MCV: 81 fL (ref 79–97)
MONOCYTES: 4 %
Monocytes Absolute: 0.4 10*3/uL (ref 0.1–0.9)
NEUTROS PCT: 69 %
Neutrophils Absolute: 6.7 10*3/uL (ref 1.4–7.0)
Platelets: 273 10*3/uL (ref 150–379)
RBC: 4.98 x10E6/uL (ref 3.77–5.28)
RDW: 15.9 % — ABNORMAL HIGH (ref 12.3–15.4)
WBC: 9.6 10*3/uL (ref 3.4–10.8)

## 2016-05-08 LAB — HEPATITIS C ANTIBODY

## 2016-05-08 LAB — SPECIMEN STATUS REPORT

## 2016-05-09 ENCOUNTER — Telehealth: Payer: Self-pay | Admitting: Family Medicine

## 2016-05-10 LAB — HM DIABETES EYE EXAM

## 2016-05-10 NOTE — Telephone Encounter (Signed)
Left detailed message on voicemail.  

## 2016-05-18 ENCOUNTER — Other Ambulatory Visit: Payer: Self-pay | Admitting: Nurse Practitioner

## 2016-05-22 ENCOUNTER — Other Ambulatory Visit: Payer: Self-pay | Admitting: Nurse Practitioner

## 2016-05-31 ENCOUNTER — Other Ambulatory Visit: Payer: Self-pay | Admitting: Nurse Practitioner

## 2016-05-31 ENCOUNTER — Other Ambulatory Visit: Payer: Self-pay | Admitting: Family Medicine

## 2016-07-05 ENCOUNTER — Other Ambulatory Visit: Payer: Self-pay | Admitting: Nurse Practitioner

## 2016-07-14 ENCOUNTER — Other Ambulatory Visit: Payer: Self-pay | Admitting: Nurse Practitioner

## 2016-07-27 ENCOUNTER — Encounter: Payer: Self-pay | Admitting: Nurse Practitioner

## 2016-07-27 ENCOUNTER — Ambulatory Visit (INDEPENDENT_AMBULATORY_CARE_PROVIDER_SITE_OTHER): Payer: BLUE CROSS/BLUE SHIELD | Admitting: Nurse Practitioner

## 2016-07-27 ENCOUNTER — Ambulatory Visit (HOSPITAL_COMMUNITY)
Admission: RE | Admit: 2016-07-27 | Discharge: 2016-07-27 | Disposition: A | Discharge status: Home / Self Care | Payer: BLUE CROSS/BLUE SHIELD | Source: Ambulatory Visit | Attending: Nurse Practitioner | Admitting: Nurse Practitioner

## 2016-07-27 VITALS — BP 135/83 | HR 75 | Temp 97.1°F | Ht 65.0 in | Wt 271.0 lb

## 2016-07-27 DIAGNOSIS — R600 Localized edema: Secondary | ICD-10-CM | POA: Diagnosis not present

## 2016-07-27 NOTE — Progress Notes (Signed)
   Subjective:    Patient ID: Hassel NethLesia O Leath, female    DOB: 1961-01-13, 55 y.o.   MRN: [id number]  HPI Patient comes in c/o of left swollen leg- is hurting in the back of her leg- she takes lasix for peripheral edema and that is nt helping this at all. Started Wednesday. Painful to walk on.    Review of Systems  Constitutional: Negative.   HENT: Negative.   Respiratory: Negative.   Cardiovascular: Negative.   Genitourinary: Negative.   Neurological: Negative.   Psychiatric/Behavioral: Negative.   All other systems reviewed and are negative.      Objective:   Physical Exam  Constitutional: She appears well-developed and well-nourished. No distress.  Cardiovascular: Normal rate and normal heart sounds.   Pulmonary/Chest: Effort normal.  Musculoskeletal:  Left lower leg edema- 2+ pitting edema anteriorly but tight feeling posteriorly- (+) homan sign- bounding pedal pulse. No erythema- cool to touch  Neurological: She is alert.  Skin: Skin is warm.  Psychiatric: She has a normal mood and affect. Her behavior is normal. Judgment and thought content normal.   BP 139/79 (BP Location: Left Arm, Patient Position: Sitting, Cuff Size: Large)   Pulse 75   Temp 97.1 F (36.2 C) (Oral)   Ht 5\' 5"  (1.651 m)   Wt 271 lb (122.9 kg)   BMI 45.10 kg/m         Assessment & Plan:  1. Lower leg edema *increase baby asa to adult asa until get doppler results back Elevate leg when sitting No strenuous activity - US Venous Img Lower Unilateral Left; Future  Mary-Margaret Daphine DeutscherMartin, FNP

## 2016-07-27 NOTE — Progress Notes (Signed)
VASCULAR LAB PRELIMINARY  PRELIMINARY  PRELIMINARY  PRELIMINARY  Left lower extremity venous duplex completed.    Preliminary report:  There is no DVT or SVT noted in the left lower extremity.  There is interstitial fluid noted in the left calf.   Anh Mangano, RVT 07/27/2016, 11:45 AM

## 2016-07-27 NOTE — Addendum Note (Signed)
Addended by: Bearl MulberryUTHERFORD, NATALIE K on: 07/27/2016 09:47 AM   Modules accepted: Orders

## 2016-07-27 NOTE — Patient Instructions (Signed)
Deep Vein Thrombosis °A deep vein thrombosis (DVT) is a blood clot (thrombus) that usually occurs in a deep, larger vein of the lower leg or the pelvis, or in an upper extremity such as the arm. These are dangerous and can lead to serious and even life-threatening complications if the clot travels to the lungs. °A DVT can damage the valves in your leg veins so that instead of flowing upward, the blood pools in the lower leg. This is called post-thrombotic syndrome, and it can result in pain, swelling, discoloration, and sores on the leg. °CAUSES °A DVT is caused by the formation of a blood clot in your leg, pelvis, or arm. Usually, several things contribute to the formation of blood clots. A clot may develop when: °· Your blood flow slows down. °· Your vein becomes damaged in some way. °· You have a condition that makes your blood clot more easily. °RISK FACTORS °A DVT is more likely to develop in: °· People who are older, especially over 60 years of age. °· People who are overweight (obese). °· People who sit or lie still for a long time, such as during long-distance travel (over 4 hours), bed rest, hospitalization, or during recovery from certain medical conditions like a stroke. °· People who do not engage in much physical activity (sedentary lifestyle). °· People who have chronic breathing disorders. °· People who have a personal or family history of blood clots or blood clotting disease. °· People who have peripheral vascular disease (PVD), diabetes, or some types of cancer. °· People who have heart disease, especially if the person had a recent heart attack or has congestive heart failure. °· People who have neurological diseases that affect the legs (leg paresis). °· People who have had a traumatic injury, such as breaking a hip or leg. °· People who have recently had major or lengthy surgery, especially on the hip, knee, or abdomen. °· People who have had a central line placed inside a large vein. °· People  who take medicines that contain the hormone estrogen. These include birth control pills and hormone replacement therapy. °· Pregnancy or during childbirth or the postpartum period. °· Long plane flights (over 8 hours). °SIGNS AND SYMPTOMS °Symptoms of a DVT can include:  °· Swelling of your leg or arm, especially if one side is much worse. °· Warmth and redness of your leg or arm, especially if one side is much worse. °· Pain in your arm or leg. If the clot is in your leg, symptoms may be more noticeable or worse when you stand or walk. °· A feeling of pins and needles, if the clot is in the arm. °The symptoms of a DVT that has traveled to the lungs (pulmonary embolism, PE) usually start suddenly and include: °· Shortness of breath while active or at rest. °· Coughing or coughing up blood or blood-tinged mucus. °· Chest pain that is often worse with deep breaths. °· Rapid or irregular heartbeat. °· Feeling light-headed or dizzy. °· Fainting. °· Feeling anxious. °· Sweating. °There may also be pain and swelling in a leg if that is where the blood clot started. °These symptoms may represent a serious problem that is an emergency. Do not wait to see if the symptoms will go away. Get medical help right away. Call your local emergency services (911 in the [place] Do not drive yourself to the hospital. °DIAGNOSIS °Your health care provider will take a medical history and perform a physical exam. You may also   have other tests, including: °· Blood tests to assess the clotting properties of your blood. °· Imaging tests, such as CT, ultrasound, MRI, X-ray, and other tests to see if you have clots anywhere in your body. °TREATMENT °After a DVT is identified, it can be treated. The type of treatment that you receive depends on many factors, such as the cause of your DVT, your risk for bleeding or developing more clots, and other medical conditions that you have. Sometimes, a combination of treatments is necessary. Treatment  options may be combined and include: °· Monitoring the blood clot with ultrasound. °· Taking medicines by mouth, such as newer blood thinners (anticoagulants), thrombolytics, or warfarin. °· Taking anticoagulant medicine by injection or through an IV tube. °· Wearing compression stockings or using different types of devices. °· Surgery (rare) to remove the blood clot or to place a filter in your abdomen to stop the blood clot from traveling to your lungs. °Treatments for a DVT are often divided into immediate treatment and long-term treatment (up to 3 months after DVT). You can work with your health care provider to choose the treatment program that is best for you. °HOME CARE INSTRUCTIONS °If you are taking a newer oral anticoagulant: °· Take the medicine every single day at the same time each day. °· Understand what foods and drugs interact with this medicine. °· Understand that there are no regular blood tests required when using this medicine. °· Understand the side effects of this medicine, including excessive bruising or bleeding. Ask your health care provider or pharmacist about other possible side effects. °If you are taking warfarin: °· Understand how to take warfarin and know which foods can affect how warfarin works in your body. °· Understand that it is dangerous to take too much or too little warfarin. Too much warfarin increases the risk of bleeding. Too little warfarin continues to allow the risk for blood clots. °· Follow your PT and INR blood testing schedule. The PT and INR results allow your health care provider to adjust your dose of warfarin. It is very important that you have your PT and INR tested as often as told by your health care provider. °· Avoid major changes in your diet, or tell your health care provider before you change your diet. Arrange a visit with a registered dietitian to answer your questions. Many foods, especially foods that are high in vitamin K, can interfere with warfarin  and affect the PT and INR results. Eat a consistent amount of foods that are high in vitamin K, such as: °¨ Spinach, kale, broccoli, cabbage, collard greens, turnip greens, Brussels sprouts, peas, cauliflower, seaweed, and parsley. °¨ Beef liver and pork liver. °¨ Green tea. °¨ Soybean oil. °· Tell your health care provider about any and all medicines, vitamins, and supplements that you take, including aspirin and other over-the-counter anti-inflammatory medicines. Be especially cautious with aspirin and anti-inflammatory medicines. Do not take those before you ask your health care provider if it is safe to do so. This is important because many medicines can interfere with warfarin and affect the PT and INR results. °· Do not start or stop taking any over-the-counter or prescription medicine unless your health care provider or pharmacist tells you to do so. °If you take warfarin, you will also need to do these things: °· Hold pressure over cuts for longer than usual. °· Tell your dentist and other health care providers that you are taking warfarin before you have any procedures in which   bleeding may occur. °· Avoid alcohol or drink very small amounts. Tell your health care provider if you change your alcohol intake. °· Do not use tobacco products, including cigarettes, chewing tobacco, and e-cigarettes. If you need help quitting, ask your health care provider. °· Avoid contact sports. °General Instructions °· Take over-the-counter and prescription medicines only as told by your health care provider. Anticoagulant medicines can have side effects, including easy bruising and difficulty stopping bleeding. If you are prescribed an anticoagulant, you will also need to do these things: °¨ Hold pressure over cuts for longer than usual. °¨ Tell your dentist and other health care providers that you are taking anticoagulants before you have any procedures in which bleeding may occur. °¨ Avoid contact sports. °· Wear a medical  alert bracelet or carry a medical alert card that says you have had a PE. °· Ask your health care provider how soon you can go back to your normal activities. Stay active to prevent new blood clots from forming. °· Make sure to exercise while traveling or when you have been sitting or standing for a long period of time. It is very important to exercise. Exercise your legs by walking or by tightening and relaxing your leg muscles often. Take frequent walks. °· Wear compression stockings as told by your health care provider to help prevent more blood clots from forming. °· Do not use tobacco products, including cigarettes, chewing tobacco, and e-cigarettes. If you need help quitting, ask your health care provider. °· Keep all follow-up appointments with your health care provider. This is important. °PREVENTION °Take these actions to decrease your risk of developing another DVT: °· Exercise regularly. For at least 30 minutes every day, engage in: °¨ Activity that involves moving your arms and legs. °¨ Activity that encourages good blood flow through your body by increasing your heart rate. °· Exercise your arms and legs every hour during long-distance travel (over 4 hours). Drink plenty of water and avoid drinking alcohol while traveling. °· Avoid sitting or lying in bed for long periods of time without moving your legs. °· Maintain a weight that is appropriate for your height. Ask your health care provider what weight is healthy for you. °· If you are a woman who is over 35 years of age, avoid unnecessary use of medicines that contain estrogen. These include birth control pills. °· Do not smoke, especially if you take estrogen medicines. If you need help quitting, ask your health care provider. °If you are hospitalized, prevention measures may include: °· Early walking after surgery, as soon as your health care provider says that it is safe. °· Receiving anticoagulants to prevent blood clots. If you cannot take  anticoagulants, other options may be available, such as wearing compression stockings or using different types of devices. °SEEK IMMEDIATE MEDICAL CARE IF: °· You have new or increased pain, swelling, or redness in an arm or leg. °· You have numbness or tingling in an arm or leg. °· You have shortness of breath while active or at rest. °· You have chest pain. °· You have a rapid or irregular heartbeat. °· You feel light-headed or dizzy. °· You cough up blood. °· You notice blood in your vomit, bowel movement, or urine. °These symptoms may represent a serious problem that is an emergency. Do not wait to see if the symptoms will go away. Get medical help right away. Call your local emergency services (911 in the [place] Do not drive yourself to the hospital. °  °  This information is not intended to replace advice given to you by your health care provider. Make sure you discuss any questions you have with your health care provider. °  °Document Released: 09/09/2005 Document Revised: 05/31/2015 Document Reviewed: 01/04/2015 °Elsevier Interactive Patient Education ©2016 [employer] ° °

## 2016-08-05 ENCOUNTER — Ambulatory Visit: Payer: BLUE CROSS/BLUE SHIELD | Admitting: Family Medicine

## 2016-08-09 ENCOUNTER — Ambulatory Visit (INDEPENDENT_AMBULATORY_CARE_PROVIDER_SITE_OTHER): Payer: BLUE CROSS/BLUE SHIELD | Admitting: Family Medicine

## 2016-08-09 ENCOUNTER — Encounter (INDEPENDENT_AMBULATORY_CARE_PROVIDER_SITE_OTHER): Payer: Self-pay

## 2016-08-09 ENCOUNTER — Encounter: Payer: Self-pay | Admitting: Family Medicine

## 2016-08-09 VITALS — BP 127/70 | HR 70 | Temp 97.7°F | Ht 65.0 in | Wt 271.4 lb

## 2016-08-09 DIAGNOSIS — E1142 Type 2 diabetes mellitus with diabetic polyneuropathy: Secondary | ICD-10-CM | POA: Diagnosis not present

## 2016-08-09 DIAGNOSIS — I10 Essential (primary) hypertension: Secondary | ICD-10-CM | POA: Diagnosis not present

## 2016-08-09 DIAGNOSIS — E785 Hyperlipidemia, unspecified: Secondary | ICD-10-CM

## 2016-08-09 LAB — BAYER DCA HB A1C WAIVED: HB A1C: 7 % — AB (ref <7.0)

## 2016-08-09 LAB — CMP14+EGFR
ALBUMIN: 4.1 g/dL (ref 3.5–5.5)
ALT: 24 IU/L (ref 0–32)
AST: 26 IU/L (ref 0–40)
Albumin/Globulin Ratio: 1.6 (ref 1.2–2.2)
Alkaline Phosphatase: 98 IU/L (ref 39–117)
BUN / CREAT RATIO: 16 (ref 9–23)
BUN: 9 mg/dL (ref 6–24)
Bilirubin Total: 0.7 mg/dL (ref 0.0–1.2)
CALCIUM: 9.2 mg/dL (ref 8.7–10.2)
CHLORIDE: 100 mmol/L (ref 96–106)
CO2: 26 mmol/L (ref 18–29)
CREATININE: 0.55 mg/dL — AB (ref 0.57–1.00)
GFR, EST AFRICAN AMERICAN: 121 mL/min/{1.73_m2} (ref >59)
GFR, EST NON AFRICAN AMERICAN: 105 mL/min/{1.73_m2} (ref >59)
GLUCOSE: 125 mg/dL — AB (ref 65–99)
Globulin, Total: 2.5 g/dL (ref 1.5–4.5)
Potassium: 4.1 mmol/L (ref 3.5–5.2)
Sodium: 141 mmol/L (ref 134–144)
TOTAL PROTEIN: 6.6 g/dL (ref 6.0–8.5)

## 2016-08-09 LAB — LIPID PANEL
Chol/HDL Ratio: 3.9 ratio units (ref 0.0–4.4)
Cholesterol, Total: 125 mg/dL (ref 100–199)
HDL: 32 mg/dL — AB (ref >39)
LDL CALC: 53 mg/dL (ref 0–99)
Triglycerides: 200 mg/dL — ABNORMAL HIGH (ref 0–149)
VLDL CHOLESTEROL CAL: 40 mg/dL (ref 5–40)

## 2016-08-09 NOTE — Patient Instructions (Signed)
Great to see you!  We will let you know how your labs were within 1 week  Come back in 3 months for diabetic follow up

## 2016-08-09 NOTE — Progress Notes (Signed)
   HPI  Patient presents today here for follow-up diabetes, hyperlipidemia, essential hypertension.  Diabetes Patient watching diet moderately, not exercising, difficulty with that due to pain in the knees. Good metformin compliance and tolerance. States she is very aware of her dietary indiscretions and would like to change them today.  Hypertension Good medication compliance, not checking blood pressure at home.  Hyperlipidemia We stopped zetia last visit, she is fasting   PMH: Smoking status noted ROS: Per HPI  Objective: BP 127/70   Pulse 70   Temp 97.7 F (36.5 C) (Oral)   Ht '5\' 5"'$  (1.651 m)   Wt 271 lb 6.4 oz (123.1 kg)   BMI 45.16 kg/m  Gen: NAD, alert, cooperative with exam HEENT: NCAT CV: RRR, good S1/S2, no murmur Resp: CTABL, no wheezes, non-labored Ext: No edema, warm Neuro: Alert and oriented, No gross deficits  Assessment and plan:  # Type 2 diabetes Controlled with A1c of 7.0, however control slipping slightly Continue current dose of metformin, improve diet and exercise offered increasing metformin, adding Victoza, or improved diet and exercise  # HLD Recheck labs LDL goal less than 100, previously LDL was 46 Stopped Zetia  # HTN Controlled Continue arb/CCB, lasix   Orders Placed This Encounter  Procedures  . Bayer DCA Hb A1c Waived  . CMP14+EGFR  . Lipid panel    Laroy Apple, MD [facility] 08/09/2016, 9:48 AM

## 2016-08-16 ENCOUNTER — Other Ambulatory Visit: Payer: Self-pay | Admitting: Family Medicine

## 2016-08-19 ENCOUNTER — Other Ambulatory Visit: Payer: Self-pay | Admitting: Nurse Practitioner

## 2016-10-11 ENCOUNTER — Other Ambulatory Visit: Payer: Self-pay | Admitting: Family Medicine

## 2016-10-25 ENCOUNTER — Encounter: Payer: Self-pay | Admitting: Family

## 2016-10-25 ENCOUNTER — Ambulatory Visit (INDEPENDENT_AMBULATORY_CARE_PROVIDER_SITE_OTHER): Payer: BLUE CROSS/BLUE SHIELD | Admitting: Family

## 2016-10-25 VITALS — BP 100/63 | HR 79 | Temp 97.0°F | Ht 65.0 in | Wt 261.0 lb

## 2016-10-25 DIAGNOSIS — I959 Hypotension, unspecified: Secondary | ICD-10-CM

## 2016-10-25 DIAGNOSIS — J209 Acute bronchitis, unspecified: Secondary | ICD-10-CM | POA: Diagnosis not present

## 2016-10-25 MED ORDER — AZITHROMYCIN 250 MG PO TABS: ORAL_TABLET | ORAL | 0 refills | Status: DC

## 2016-10-25 NOTE — Progress Notes (Signed)
   Subjective:    Patient ID: Carla Mason, female    DOB: 02/01/1961, 56 y.o.   MRN: [id number]  Cough  This is a new problem. The current episode started in the past 7 days. The problem has been gradually worsening. The problem occurs every few minutes. The cough is non-productive. Associated symptoms include chills, myalgias and rhinorrhea. Pertinent negatives include no ear congestion, ear pain, fever, headaches, nasal congestion, sore throat or wheezing. The symptoms are aggravated by lying down. She has tried rest and OTC cough suppressant for the symptoms. The treatment provided mild relief. There is no history of asthma or COPD.      Review of Systems  Constitutional: Positive for chills. Negative for fever.  HENT: Positive for rhinorrhea. Negative for ear pain and sore throat.   Respiratory: Positive for cough. Negative for wheezing.   Musculoskeletal: Positive for myalgias.  Neurological: Negative for headaches.  All other systems reviewed and are negative.      Objective:   Physical Exam  Constitutional: She is oriented to person, place, and time. She appears well-developed and well-nourished. No distress.  HENT:  Head: Normocephalic and atraumatic.  Right Ear: External ear normal.  Left Ear: External ear normal.  Nose: Mucosal edema and rhinorrhea present.  Eyes: Pupils are equal, round, and reactive to light.  Neck: Normal range of motion. Neck supple. No thyromegaly present.  Cardiovascular: Normal rate, regular rhythm, normal heart sounds and intact distal pulses.   No murmur heard. Pulmonary/Chest: Effort normal and breath sounds normal. No respiratory distress. She has no wheezes.  Intermittent coarse nonproductive cough   Abdominal: Soft. Bowel sounds are normal. She exhibits no distension. There is no tenderness.  Musculoskeletal: Normal range of motion. She exhibits no edema or tenderness.  Neurological: She is alert and oriented to person, place, and time.  She has normal reflexes. No cranial nerve deficit.  Skin: Skin is warm and dry.  Psychiatric: She has a normal mood and affect. Her behavior is normal. Judgment and thought content normal.  Vitals reviewed.     BP 91/64   Pulse 79   Temp 97 F (36.1 C) (Oral)   Ht 5\' 5"  (1.651 m)   Wt 261 lb (118.4 kg)   BMI 43.43 kg/m      Assessment & Plan:  1. Acute bronchitis, unspecified organism - Take meds as prescribed - Use a cool mist humidifier  -Use saline nose sprays frequently -Saline irrigations of the nose can be very helpful if done frequently.  * 4X daily for 1 week*  * Use of a nettie pot can be helpful with this. Follow directions with this* -Force fluids -For any cough or congestion  Use plain Mucinex- regular strength or max strength is fine   * Children- consult with Pharmacist for dosing -For fever or aces or pains- take tylenol or ibuprofen appropriate for age and weight.  * for fevers greater than 101 orally you may alternate ibuprofen and tylenol every  3 hours. -Throat lozenges if help - azithromycin (ZITHROMAX) 250 MG tablet; Take 500 mg once, then 250 mg for four days  Dispense: 6 tablet; Refill: 0  2. Hypotension, unspecified hypotension type -Pt to hold lasix tomorrow (already taken all of her BP meds today) Will monitor BP at home Force fluids RTO on 10/29/16 to recheck BP  Jannifer Rodneyhristy Ward Boissonneault, FNP

## 2016-10-25 NOTE — Patient Instructions (Signed)

## 2016-10-29 ENCOUNTER — Ambulatory Visit (INDEPENDENT_AMBULATORY_CARE_PROVIDER_SITE_OTHER): Payer: BLUE CROSS/BLUE SHIELD | Admitting: Physician Assistant

## 2016-10-29 ENCOUNTER — Encounter: Payer: Self-pay | Admitting: Physician Assistant

## 2016-10-29 VITALS — BP 135/79 | HR 68 | Temp 98.3°F | Ht 65.0 in | Wt 262.0 lb

## 2016-10-29 DIAGNOSIS — J40 Bronchitis, not specified as acute or chronic: Secondary | ICD-10-CM | POA: Diagnosis not present

## 2016-10-29 MED ORDER — METHYLPREDNISOLONE ACETATE 80 MG/ML IJ SUSP
80.0000 mg | Freq: Once | INTRAMUSCULAR | Status: AC
  Administered 2016-10-29: 80 mg via INTRAMUSCULAR

## 2016-10-29 MED ORDER — ALBUTEROL SULFATE HFA 108 (90 BASE) MCG/ACT IN AERS: 2.0000 | INHALATION_SPRAY | Freq: Four times a day (QID) | RESPIRATORY_TRACT | 0 refills | Status: DC | PRN

## 2016-10-29 NOTE — Patient Instructions (Signed)

## 2016-10-30 NOTE — Progress Notes (Signed)
BP 135/79   Pulse 68   Temp 98.3 F (36.8 C) (Oral)   Ht 5\' 5"  (1.651 m)   Wt 262 lb (118.8 kg)   BMI 43.60 kg/m    Subjective:    Patient ID: Carla Mason, female    DOB: [birth year], 56 y.o.   MRN: [id number]  HPI: Carla NethLesia O Wedin is a 56 y.o. female presenting on 10/29/2016 for BP follow up (From seeing Neysa BonitoChristy on the 2nd )  Patient was seen a few days ago. For the start of bronchitis. She had a very low blood pressure while in the office that day. She was instructed to follow-up in a few days to have it rechecked. 0.0 scheduled with me. She states she she is feeling better with her blood pressure has checked it on occasion. She has had continued cough and wheezing. It had a long discussion about the longevity of bronchitis in the triggers that can make her cough.  Relevant past medical, surgical, family and social history reviewed and updated as indicated. Allergies and medications reviewed and updated.  Past Medical History:  Diagnosis Date  . Diabetes mellitus without complication (HCC)   . Hyperlipidemia   . Hypertension   . Neuropathy (HCC)   . Obesity   . Vitamin D deficiency disease     Past Surgical History:  Procedure Laterality Date  . ABDOMINAL HYSTERECTOMY    . KNEE SURGERY      Review of Systems  Constitutional: Negative.  Negative for activity change, fatigue and fever.  HENT: Positive for congestion and sinus pain.   Eyes: Negative.   Respiratory: Positive for cough and shortness of breath. Negative for wheezing.   Cardiovascular: Negative.  Negative for chest pain.  Gastrointestinal: Negative.  Negative for abdominal pain.  Endocrine: Negative.   Genitourinary: Negative.  Negative for dysuria.  Musculoskeletal: Negative.   Skin: Negative.   Neurological: Negative.  Negative for dizziness, weakness and light-headedness.    Allergies as of 10/29/2016   No Known Allergies     Medication List       Accurate as of 10/29/16 11:59 PM. Always use your  most recent med list.          albuterol 108 (90 Base) MCG/ACT inhaler Commonly known as:  PROVENTIL HFA;VENTOLIN HFA Inhale 2 puffs into the lungs every 6 (six) hours as needed for wheezing or shortness of breath.   amLODipine-olmesartan 5-40 MG tablet Commonly known as:  AZOR TAKE 1 TABLET BY MOUTH DAILY.   aspirin 81 MG chewable tablet Chew 81 mg by mouth daily.   atorvastatin 40 MG tablet Commonly known as:  LIPITOR TAKE 1 TABLET (40 MG TOTAL) BY MOUTH DAILY.   atorvastatin 40 MG tablet Commonly known as:  LIPITOR TAKE 1 TABLET (40 MG TOTAL) BY MOUTH DAILY.   azithromycin 250 MG tablet Commonly known as:  ZITHROMAX Take 500 mg once, then 250 mg for four days   betamethasone (augmented) 0.05 % lotion Commonly known as:  DIPROLENE APPLY TOPICALLY 2 (TWO) TIMES DAILY.   cholecalciferol 1000 units tablet Commonly known as:  VITAMIN D Take 2,000 Units by mouth daily.   FREESTYLE INSULINX TEST test strip Generic drug:  glucose blood TEST ONCE A DAY   freestyle lancets Use to check blood sugar daily   furosemide 20 MG tablet Commonly known as:  LASIX Take 1 tablet (20 mg total) by mouth daily.   furosemide 20 MG tablet Commonly known as:  LASIX TAKE 1 TABLET (  20 MG TOTAL) BY MOUTH DAILY.   metFORMIN 500 MG tablet Commonly known as:  GLUCOPHAGE TAKE 1 TABLET (500 MG TOTAL) BY MOUTH 2 (TWO) TIMES DAILY WITH A MEAL.   mupirocin ointment 2 % Commonly known as:  BACTROBAN Place 1 application into the nose 2 (two) times daily.          Objective:    BP 135/79   Pulse 68   Temp 98.3 F (36.8 C) (Oral)   Ht 5\' 5"  (1.651 m)   Wt 262 lb (118.8 kg)   BMI 43.60 kg/m   No Known Allergies  Physical Exam  Constitutional: She is oriented to person, place, and time. She appears well-developed and well-nourished.  HENT:  Head: Normocephalic and atraumatic.  Eyes: Conjunctivae and EOM are normal. Pupils are equal, round, and reactive to light.  Neck: Normal  range of motion. Neck supple.  Cardiovascular: Normal rate, regular rhythm, normal heart sounds and intact distal pulses.   Pulmonary/Chest: Effort normal. No respiratory distress. She has wheezes. She has rales. She exhibits no tenderness.  Abdominal: Soft. Bowel sounds are normal.  Neurological: She is alert and oriented to person, place, and time. She has normal reflexes.  Skin: Skin is warm and dry. No rash noted.  Psychiatric: She has a normal mood and affect. Her behavior is normal. Judgment and thought content normal.        Assessment & Plan:   1. Bronchitis - methylPREDNISolone acetate (DEPO-MEDROL) injection 80 mg; Inject 1 mL (80 mg total) into the muscle once. - albuterol (PROVENTIL HFA;VENTOLIN HFA) 108 (90 Base) MCG/ACT inhaler; Inhale 2 puffs into the lungs every 6 (six) hours as needed for wheezing or shortness of breath.  Dispense: 1 Inhaler; Refill: 0  2. HYPOTENSION resolved  Continue all other maintenance medications as listed above.  Follow up plan: Return for keep follow up.  No orders of the defined types were placed in this encounter.   Educational handout given for bronchitis  Remus Loffler PA-C [facility] [address]  [place], [place] [address] [phone]   10/30/2016, 1:38 PM

## 2016-11-06 ENCOUNTER — Other Ambulatory Visit: Payer: Self-pay | Admitting: Family Medicine

## 2016-11-14 ENCOUNTER — Other Ambulatory Visit: Payer: Self-pay | Admitting: Family Medicine

## 2016-11-29 ENCOUNTER — Encounter: Payer: Self-pay | Admitting: Family Medicine

## 2016-11-29 ENCOUNTER — Ambulatory Visit (INDEPENDENT_AMBULATORY_CARE_PROVIDER_SITE_OTHER): Payer: BLUE CROSS/BLUE SHIELD | Admitting: Family Medicine

## 2016-11-29 VITALS — BP 123/73 | HR 72 | Temp 97.0°F | Ht 65.0 in | Wt 263.6 lb

## 2016-11-29 DIAGNOSIS — E1142 Type 2 diabetes mellitus with diabetic polyneuropathy: Secondary | ICD-10-CM

## 2016-11-29 DIAGNOSIS — R609 Edema, unspecified: Secondary | ICD-10-CM | POA: Diagnosis not present

## 2016-11-29 DIAGNOSIS — E785 Hyperlipidemia, unspecified: Secondary | ICD-10-CM | POA: Diagnosis not present

## 2016-11-29 LAB — BAYER DCA HB A1C WAIVED: HB A1C: 6.4 % (ref <7.0)

## 2016-11-29 MED ORDER — FUROSEMIDE 20 MG PO TABS: 20.0000 mg | ORAL_TABLET | Freq: Every day | ORAL | 1 refills | Status: DC

## 2016-11-29 MED ORDER — ATORVASTATIN CALCIUM 40 MG PO TABS: ORAL_TABLET | ORAL | 3 refills | Status: DC

## 2016-11-29 NOTE — Progress Notes (Signed)
   HPI  Patient presents today here for follow-up chronic medical condition.  Diabetes Good medication plans of metformin Patient only checking blood sugar occasionally, average fasting is 100-130 She's not really watching her diet. He was exposed to IM Depo-Medrol for acute bronchitis a few weeks ago.  Patient does not want colonoscopy, she has had a hysterectomy but does not know if she has had her cervix removed. It has been more than 5 years since her last Pap smear.  Her edema is stable, she uses Lasix for this.  PMH: Smoking status noted ROS: Per HPI  Objective: BP 123/73   Pulse 72   Temp 97 F (36.1 C) (Oral)   Ht 5\' 5"  (1.651 m)   Wt 263 lb 9.6 oz (119.6 kg)   BMI 43.87 kg/m  Gen: NAD, alert, cooperative with exam HEENT: NCAT CV: RRR, good S1/S2, no murmur Resp: CTABL, no wheezes, non-labored Ext: Trace pitting edema bilaterally Neuro: Alert and oriented, No gross deficits  Diabetic Foot Exam - Simple   Simple Foot Form Diabetic Foot exam was performed with the following findings:  Yes 11/29/2016 12:10 PM  Visual Inspection No deformities, no ulcerations, no other skin breakdown bilaterally:  Yes Sensation Testing Intact to touch and monofilament testing bilaterally:  Yes Pulse Check Posterior Tibialis and Dorsalis pulse intact bilaterally:  Yes Comments    Assessment and plan:  # Type 2 diabetes Well-controlled, A1c 6.4 No changes of metformin Discussed diet and exercise Foot exam as above  # Peripheral edema Treated with Lasix long term, patient is stable, refilled Normal kidney function Prefer non pharmacologic Tx but she has done well with this long term so I will not make changes at this time.   # Hyperlipidemia Patient on only statin, previously stopped ezetimibe, he is continued to be at goal. Stable Recheck annually      Orders Placed This Encounter  Procedures  . Microalbumin / creatinine urine ratio  . Bayer DCA Hb A1c Waived     Meds ordered this encounter  Medications  . furosemide (LASIX) 20 MG tablet    Sig: Take 1 tablet (20 mg total) by mouth daily.    Dispense:  90 tablet    Refill:  1  . atorvastatin (LIPITOR) 40 MG tablet    Sig: TAKE 1 TABLET (40 MG TOTAL) BY MOUTH DAILY.    Dispense:  90 tablet    Refill:  3    Murtis SinkSam Galan Ghee, MD [facility] 11/29/2016, 12:15 PM

## 2016-11-30 LAB — MICROALBUMIN / CREATININE URINE RATIO
CREATININE, UR: 30 mg/dL
Microalb/Creat Ratio: <10 mg/g creat (ref 0.0–30.0)
Microalbumin, Urine: <3 ug/mL

## 2016-12-06 ENCOUNTER — Other Ambulatory Visit (INDEPENDENT_AMBULATORY_CARE_PROVIDER_SITE_OTHER): Payer: BLUE CROSS/BLUE SHIELD

## 2016-12-06 DIAGNOSIS — Z129 Encounter for screening for malignant neoplasm, site unspecified: Secondary | ICD-10-CM

## 2016-12-06 DIAGNOSIS — Z1211 Encounter for screening for malignant neoplasm of colon: Secondary | ICD-10-CM

## 2016-12-08 LAB — FECAL OCCULT BLOOD, IMMUNOCHEMICAL: Fecal Occult Bld: NEGATIVE

## 2016-12-09 ENCOUNTER — Telehealth: Payer: Self-pay | Admitting: Family Medicine

## 2016-12-10 ENCOUNTER — Encounter: Payer: Self-pay | Admitting: *Deleted

## 2016-12-10 NOTE — Telephone Encounter (Signed)
Left message stating that stool card was negative and to call back with any questions or concerns

## 2016-12-17 ENCOUNTER — Encounter: Payer: Self-pay | Admitting: *Deleted

## 2017-01-31 ENCOUNTER — Other Ambulatory Visit: Payer: Self-pay | Admitting: Nurse Practitioner

## 2017-02-21 ENCOUNTER — Encounter: Payer: Self-pay | Admitting: Family Medicine

## 2017-02-21 ENCOUNTER — Ambulatory Visit (INDEPENDENT_AMBULATORY_CARE_PROVIDER_SITE_OTHER): Payer: BLUE CROSS/BLUE SHIELD

## 2017-02-21 ENCOUNTER — Ambulatory Visit (INDEPENDENT_AMBULATORY_CARE_PROVIDER_SITE_OTHER): Payer: BLUE CROSS/BLUE SHIELD | Admitting: Family Medicine

## 2017-02-21 VITALS — BP 130/77 | HR 75 | Temp 98.5°F | Ht 65.0 in | Wt 270.0 lb

## 2017-02-21 DIAGNOSIS — G8929 Other chronic pain: Secondary | ICD-10-CM

## 2017-02-21 DIAGNOSIS — R609 Edema, unspecified: Secondary | ICD-10-CM | POA: Diagnosis not present

## 2017-02-21 DIAGNOSIS — M25561 Pain in right knee: Secondary | ICD-10-CM

## 2017-02-21 NOTE — Progress Notes (Signed)
BP 130/77   Pulse 75   Temp 98.5 F (36.9 C) (Oral)   Ht 5\' 5"  (1.651 m)   Wt 270 lb (122.5 kg)   BMI 44.93 kg/m    Subjective:    Patient ID: Carla Mason, female    DOB: 02/18/1961, 56 y.o.   MRN: [id number]  HPI: Carla Mason is a 56 y.o. female presenting on 02/21/2017 for Edema in knees, right knee pain (wearing compression stockings, takes 20 mg Lasix daily, has increased to bid the last 2 days)   HPI Edema in legs and right knee pain Patient has been having increasing issues with edema in her legs and pain in her right knee that has been causing her issues off and on over the past months. The edema has been over even longer period of time. She has compression stockings but they do not compress very much she does wear them but still gets swelling and edema. She admits she does not wear them on the weekend. She says the swelling will go down overnight but then increases throughout the day. She denies any fevers or chills or redness or warmth. She is also been taking Lasix 20 mg daily and it seems to help some and she doubled it to start taking them twice a day but she did not notice a difference with this. Her right knee hurts more when she is on her feet for prolonged periods and she thinks it's on the middle aspect of the knee but cannot recall his does not hurting currently right now.  Relevant past medical, surgical, family and social history reviewed and updated as indicated. Interim medical history since our last visit reviewed. Allergies and medications reviewed and updated.  Review of Systems  Constitutional: Negative for chills and fever.  Respiratory: Negative for chest tightness and shortness of breath.   Cardiovascular: Positive for leg swelling. Negative for chest pain.  Genitourinary: Negative for difficulty urinating and dysuria.  Musculoskeletal: Positive for arthralgias. Negative for back pain, gait problem and joint swelling.  Skin: Negative for rash.    Neurological: Negative for light-headedness and headaches.  Psychiatric/Behavioral: Negative for agitation and behavioral problems.  All other systems reviewed and are negative.   Per HPI unless specifically indicated above        Objective:    BP 130/77   Pulse 75   Temp 98.5 F (36.9 C) (Oral)   Ht 5\' 5"  (1.651 m)   Wt 270 lb (122.5 kg)   BMI 44.93 kg/m   Wt Readings from Last 3 Encounters:  02/21/17 270 lb (122.5 kg)  11/29/16 263 lb 9.6 oz (119.6 kg)  10/29/16 262 lb (118.8 kg)    Physical Exam  Constitutional: She is oriented to person, place, and time. She appears well-developed and well-nourished. No distress.  Eyes: Conjunctivae are normal.  Cardiovascular: Normal rate, regular rhythm, normal heart sounds and intact distal pulses.   No murmur heard. Pulmonary/Chest: Effort normal and breath sounds normal. No respiratory distress. She has no wheezes. She has no rales.  Musculoskeletal: Normal range of motion. She exhibits edema (2+ pitting edema bilateral lower extremities). She exhibits no tenderness (No knee tenderness noted on exam today, range of motion intact and unable to elicit any tenderness with range of motion).  Neurological: She is alert and oriented to person, place, and time. Coordination normal.  Skin: Skin is warm and dry. No rash noted. She is not diaphoretic.  Psychiatric: She has a normal mood and  affect. Her behavior is normal.  Nursing note and vitals reviewed.   Right knee x-ray: Patient has bone-on-bone on both medial knees, severe osteoarthritis    Assessment & Plan:   Problem List Items Addressed This Visit      Other   Peripheral edema - Primary    Other Visit Diagnoses    Chronic pain of right knee       Likely arthritis, does not hurt as much today, will do x-ray to see works at   Relevant Orders   DG Knee 1-2 Views Right       Follow up plan: Return if symptoms worsen or fail to improve.  Counseling provided for all of  the vaccine components Orders Placed This Encounter  Procedures  . DG Knee 1-2 Views Right    Arville CareJoshua Dettinger, MD [facility] 02/21/2017, 3:35 PM

## 2017-03-24 ENCOUNTER — Other Ambulatory Visit: Payer: Self-pay | Admitting: Nurse Practitioner

## 2017-03-28 ENCOUNTER — Ambulatory Visit: Payer: BLUE CROSS/BLUE SHIELD | Admitting: Family Medicine

## 2017-03-31 ENCOUNTER — Ambulatory Visit: Payer: BLUE CROSS/BLUE SHIELD | Admitting: Family Medicine

## 2017-04-11 ENCOUNTER — Ambulatory Visit: Payer: BLUE CROSS/BLUE SHIELD | Admitting: Family Medicine

## 2017-04-27 ENCOUNTER — Other Ambulatory Visit: Payer: Self-pay | Admitting: Family Medicine

## 2017-04-30 ENCOUNTER — Other Ambulatory Visit: Payer: Self-pay | Admitting: Family Medicine

## 2017-04-30 ENCOUNTER — Ambulatory Visit (INDEPENDENT_AMBULATORY_CARE_PROVIDER_SITE_OTHER): Payer: BLUE CROSS/BLUE SHIELD | Admitting: Family Medicine

## 2017-04-30 VITALS — BP 136/78 | HR 78 | Temp 98.4°F | Ht 65.0 in | Wt 263.6 lb

## 2017-04-30 DIAGNOSIS — I10 Essential (primary) hypertension: Secondary | ICD-10-CM

## 2017-04-30 DIAGNOSIS — M79671 Pain in right foot: Secondary | ICD-10-CM | POA: Diagnosis not present

## 2017-04-30 DIAGNOSIS — E785 Hyperlipidemia, unspecified: Secondary | ICD-10-CM | POA: Diagnosis not present

## 2017-04-30 DIAGNOSIS — E1142 Type 2 diabetes mellitus with diabetic polyneuropathy: Secondary | ICD-10-CM | POA: Diagnosis not present

## 2017-04-30 DIAGNOSIS — M25561 Pain in right knee: Secondary | ICD-10-CM | POA: Diagnosis not present

## 2017-04-30 DIAGNOSIS — G8929 Other chronic pain: Secondary | ICD-10-CM | POA: Diagnosis not present

## 2017-04-30 LAB — BAYER DCA HB A1C WAIVED: HB A1C (BAYER DCA - WAIVED): 7.2 % — ABNORMAL HIGH (ref <7.0)

## 2017-04-30 NOTE — Progress Notes (Addendum)
   HPI  Patient presents today here for follow-up chronic medical conditions.  She is fasting today Hyperlipidemia, diabetes Not really watching diet Good medication compliance and tolerance. Average fasting blood sugar is 120-140. No hypoglycemia.  Right heel pain Posterior heel pain at the base of the Achilles tendon No injury Described as discomfort, noticeable whenever she wears her compression stockings. No severe pain.  Knee swelling Patient does not with the orthopedics or consider injections yet. Had x-rays last visit showing arthritis.   PMH: Smoking status noted ROS: Per HPI  Objective: BP 136/78   Pulse 78   Temp 98.4 F (36.9 C) (Oral)   Ht _0  (1.651 m)   Wt 263 lb 9.6 oz (119.6 kg)   BMI 43.87 kg/m  Gen: NAD, alert, cooperative with exam HEENT: NCAT CV: RRR, good S1/S2, no murmur Resp: CTABL, no wheezes, non-labored Ext: No edema, warm Neuro: Alert and oriented, No gross deficits  Foot exam Approximate 2 cm nodule of the base of the Achilles on the tip of the calcaneus Nontender to palpation  Assessment and plan:  # Type 2 diabetes A1c slightly worse at 7.2 Continue metformin Discussed therapeutic lifestyle changes  # Hypertension Well-controlled on olmesartan and amlodipine Labs Continue Lasix as well  # Hyperlipidemia Previously well controlled on atorvastatin Continue Labs, fasting  # Right knee pain Chronic, likely OA Discussed utility of knee injection Discussed orthopedic referral, she would like to defer Recommended knee sleeve while working  Right heel pain Small nodule that's nontender, likely old Achilles injury or bone spur Mild symptoms Reassurance, low threshold for follow-up of symptoms worsen    Orders Placed This Encounter  Procedures  . CMP14+EGFR  . CBC with Differential/Platelet  . Lipid panel  . TSH  . Bayer DCA Hb A1c Waived    No orders of the defined types were placed in this  encounter.   Laroy Apple, MD [facility] 05/01/2017, 11:45 AM

## 2017-05-01 ENCOUNTER — Encounter: Payer: Self-pay | Admitting: Family Medicine

## 2017-05-01 LAB — CBC WITH DIFFERENTIAL/PLATELET
BASOS: 1 %
Basophils Absolute: 0.1 10*3/uL (ref 0.0–0.2)
EOS (ABSOLUTE): 0.3 10*3/uL (ref 0.0–0.4)
EOS: 2 %
HEMATOCRIT: 39.5 % (ref 34.0–46.6)
HEMOGLOBIN: 12.4 g/dL (ref 11.1–15.9)
IMMATURE GRANS (ABS): 0 10*3/uL (ref 0.0–0.1)
IMMATURE GRANULOCYTES: 0 %
Lymphocytes Absolute: 2.8 10*3/uL (ref 0.7–3.1)
Lymphs: 23 %
MCH: 25.3 pg — ABNORMAL LOW (ref 26.6–33.0)
MCHC: 31.4 g/dL — ABNORMAL LOW (ref 31.5–35.7)
MCV: 80 fL (ref 79–97)
MONOS ABS: 0.5 10*3/uL (ref 0.1–0.9)
Monocytes: 4 %
NEUTROS PCT: 70 %
Neutrophils Absolute: 8.5 10*3/uL — ABNORMAL HIGH (ref 1.4–7.0)
Platelets: 296 10*3/uL (ref 150–379)
RBC: 4.91 x10E6/uL (ref 3.77–5.28)
RDW: 16.3 % — AB (ref 12.3–15.4)
WBC: 12.2 10*3/uL — ABNORMAL HIGH (ref 3.4–10.8)

## 2017-05-01 LAB — BASIC METABOLIC PANEL
BUN/Creatinine Ratio: 18 (ref 9–23)
BUN: 12 mg/dL (ref 6–24)
CALCIUM: 9.5 mg/dL (ref 8.7–10.2)
CO2: 25 mmol/L (ref 20–29)
CREATININE: 0.65 mg/dL (ref 0.57–1.00)
Chloride: 101 mmol/L (ref 96–106)
GFR calc Af Amer: 114 mL/min/{1.73_m2} (ref >59)
GFR, EST NON AFRICAN AMERICAN: 99 mL/min/{1.73_m2} (ref >59)
Glucose: 133 mg/dL — ABNORMAL HIGH (ref 65–99)
POTASSIUM: 4 mmol/L (ref 3.5–5.2)
Sodium: 141 mmol/L (ref 134–144)

## 2017-05-01 LAB — LIPID PANEL W/O CHOL/HDL RATIO
Cholesterol, Total: 115 mg/dL (ref 100–199)
HDL: 34 mg/dL — AB (ref >39)
LDL Calculated: 57 mg/dL (ref 0–99)
TRIGLYCERIDES: 120 mg/dL (ref 0–149)
VLDL Cholesterol Cal: 24 mg/dL (ref 5–40)

## 2017-05-01 LAB — TSH: TSH: 2.42 u[IU]/mL (ref 0.450–4.500)

## 2017-06-27 LAB — HM DIABETES EYE EXAM

## 2017-07-26 ENCOUNTER — Other Ambulatory Visit: Payer: Self-pay | Admitting: Family Medicine

## 2017-09-02 ENCOUNTER — Ambulatory Visit: Payer: BLUE CROSS/BLUE SHIELD | Admitting: Family Medicine

## 2017-09-12 ENCOUNTER — Ambulatory Visit: Payer: BLUE CROSS/BLUE SHIELD | Admitting: Family Medicine

## 2017-09-12 ENCOUNTER — Encounter: Payer: Self-pay | Admitting: Family Medicine

## 2017-09-12 VITALS — BP 124/79 | HR 76 | Temp 98.0°F | Ht 65.0 in | Wt 254.0 lb

## 2017-09-12 DIAGNOSIS — L989 Disorder of the skin and subcutaneous tissue, unspecified: Secondary | ICD-10-CM

## 2017-09-12 DIAGNOSIS — E1142 Type 2 diabetes mellitus with diabetic polyneuropathy: Secondary | ICD-10-CM

## 2017-09-12 DIAGNOSIS — I10 Essential (primary) hypertension: Secondary | ICD-10-CM | POA: Diagnosis not present

## 2017-09-12 MED ORDER — GLUCOSE BLOOD VI STRP: ORAL_STRIP | 3 refills | Status: DC

## 2017-09-12 MED ORDER — METFORMIN HCL 500 MG PO TABS: ORAL_TABLET | ORAL | 3 refills | Status: DC

## 2017-09-12 MED ORDER — FREESTYLE LANCETS MISC: 2 refills | Status: DC

## 2017-09-12 NOTE — Progress Notes (Signed)
   HPI  Patient presents today for follow-up chronic medical conditions   type 2 diabetes Good metformin compliance, watching diet closely, no regular exercise.  Skin lesion Left inner thigh 4-5 lesions that seem to be getting worse. They seem similar to some right lateral thigh lesions that have gotten better.  Hypertension Good medication compliance and tolerance,   PMH: Smoking status noted ROS: Per HPI  Objective: BP 124/79 (BP Location: Right Arm, Patient Position: Sitting, Cuff Size: Large)   Pulse 76   Temp 98 F (36.7 C) (Oral)   Ht 5\' 5"  (1.651 m)   Wt 254 lb (115.2 kg)   BMI 42.27 kg/m  Gen: NAD, alert, cooperative with exam HEENT: NCAT CV: RRR, good S1/S2, no murmur Resp: CTABL, no wheezes, non-labored Ext: No edema, warm Neuro: Alert and oriented, No gross deficits  Diabetic Foot Exam - Simple   Simple Foot Form Diabetic Foot exam was performed with the following findings:  Yes 09/12/2017  4:00 PM  Visual Inspection No deformities, no ulcerations, no other skin breakdown bilaterally:  Yes Sensation Testing Intact to touch and monofilament testing bilaterally:  Yes Pulse Check Posterior Tibialis and Dorsalis pulse intact bilaterally:  Yes Comments    Left inner thigh with 4-5 hyperkeratotic lesions on brown base  Assessment and plan:  #Type 2 diabetes Previously well controlled, consistent with good control still. Continue metformin A1c pending  #Skin lesion Possibly seborrheic keratosis, however given location and number of lesions I recommended seeing dermatology, she will call her dermatologist for an appointment  #Hypertension Well controlled on olmesartan plus amlodipine, no changes Labs up-to-date    Orders Placed This Encounter  Procedures  . Bayer DCA Hb A1c Waived    Meds ordered this encounter  Medications  . Lancets (FREESTYLE) lancets    Sig: Use to check blood sugar daily    Dispense:  100 each    Refill:  2  .  metFORMIN (GLUCOPHAGE) 500 MG tablet    Sig: TAKE 1 TABLET (500 MG TOTAL) BY MOUTH 2 (TWO) TIMES DAILY WITH A MEAL.    Dispense:  180 tablet    Refill:  3  . glucose blood (FREESTYLE INSULINX TEST) test strip    Sig: TEST ONCE A DAY    Dispense:  100 each    Refill:  3    Dx: E11.9    Murtis SinkSam Ulric Salzman, MD [facility] 09/12/2017, 4:03 PM

## 2017-09-12 NOTE — Patient Instructions (Signed)
Great to see you!   Come back in 4 months for follow up of diabetes   

## 2017-09-12 NOTE — Addendum Note (Signed)
Addended by: Gwenith DailyHUDY, Danney Bungert N on: 09/12/2017 05:09 PM   Modules accepted: Orders

## 2017-10-25 ENCOUNTER — Other Ambulatory Visit: Payer: Self-pay | Admitting: Family Medicine

## 2017-10-31 DIAGNOSIS — D229 Melanocytic nevi, unspecified: Secondary | ICD-10-CM | POA: Diagnosis not present

## 2017-10-31 DIAGNOSIS — Z1283 Encounter for screening for malignant neoplasm of skin: Secondary | ICD-10-CM | POA: Diagnosis not present

## 2017-10-31 DIAGNOSIS — L821 Other seborrheic keratosis: Secondary | ICD-10-CM | POA: Diagnosis not present

## 2017-10-31 DIAGNOSIS — L812 Freckles: Secondary | ICD-10-CM | POA: Diagnosis not present

## 2017-10-31 DIAGNOSIS — D1800 Hemangioma unspecified site: Secondary | ICD-10-CM | POA: Diagnosis not present

## 2017-11-28 ENCOUNTER — Encounter: Payer: Self-pay | Admitting: *Deleted

## 2017-12-11 ENCOUNTER — Other Ambulatory Visit: Payer: Self-pay | Admitting: *Deleted

## 2017-12-11 DIAGNOSIS — R609 Edema, unspecified: Secondary | ICD-10-CM

## 2017-12-11 MED ORDER — FUROSEMIDE 20 MG PO TABS: 20.0000 mg | ORAL_TABLET | Freq: Every day | ORAL | 0 refills | Status: DC

## 2018-01-16 ENCOUNTER — Ambulatory Visit: Payer: BLUE CROSS/BLUE SHIELD | Admitting: Family Medicine

## 2018-01-19 ENCOUNTER — Ambulatory Visit: Payer: BLUE CROSS/BLUE SHIELD | Admitting: Family Medicine

## 2018-01-23 ENCOUNTER — Encounter: Payer: Self-pay | Admitting: Family Medicine

## 2018-01-23 ENCOUNTER — Ambulatory Visit: Payer: BLUE CROSS/BLUE SHIELD | Admitting: Family Medicine

## 2018-01-23 VITALS — BP 122/69 | HR 66 | Temp 97.2°F | Ht 65.0 in | Wt 254.2 lb

## 2018-01-23 DIAGNOSIS — M25561 Pain in right knee: Secondary | ICD-10-CM

## 2018-01-23 DIAGNOSIS — R609 Edema, unspecified: Secondary | ICD-10-CM | POA: Diagnosis not present

## 2018-01-23 DIAGNOSIS — G8929 Other chronic pain: Secondary | ICD-10-CM

## 2018-01-23 DIAGNOSIS — E785 Hyperlipidemia, unspecified: Secondary | ICD-10-CM

## 2018-01-23 DIAGNOSIS — E1142 Type 2 diabetes mellitus with diabetic polyneuropathy: Secondary | ICD-10-CM

## 2018-01-23 LAB — BAYER DCA HB A1C WAIVED: HB A1C: 6.4 % (ref <7.0)

## 2018-01-23 MED ORDER — ATORVASTATIN CALCIUM 40 MG PO TABS: ORAL_TABLET | ORAL | 3 refills | Status: DC

## 2018-01-23 MED ORDER — AMLODIPINE-OLMESARTAN 5-40 MG PO TABS: 1.0000 | ORAL_TABLET | Freq: Every day | ORAL | 3 refills | Status: DC

## 2018-01-23 MED ORDER — METFORMIN HCL 500 MG PO TABS: ORAL_TABLET | ORAL | 3 refills | Status: DC

## 2018-01-23 MED ORDER — FUROSEMIDE 20 MG PO TABS: 20.0000 mg | ORAL_TABLET | Freq: Every day | ORAL | 1 refills | Status: DC

## 2018-01-23 NOTE — Progress Notes (Signed)
   HPI  Patient presents today for follow-up chronic medical conditions.  Type 2 diabetes Good medication compliance and tolerance Average fasting blood sugar 1 20-1 40  Patient does have diabetic neuropathy, she has callus on her left foot, she is interested in diabetic shoes.  She is also having some right knee pain is been going on for quite a while, she previously had left knee surgery and was told by an orthopedic surgeon that she did have arthritis.  PMH: Smoking status noted ROS: Per HPI  Objective: BP 122/69   Pulse 66   Temp (!) 97.2 F (36.2 C) (Oral)   Ht  (1.651 m)   Wt 254 lb 3.2 oz (115.3 kg)   BMI 42.30 kg/m  Gen: NAD, alert, cooperative with exam HEENT: NCAT CV: RRR, good S1/S2, no murmur Resp: CTABL, no wheezes, non-labored Ext: No edema, warm Neuro: Alert and oriented, No gross deficits MSK: R knee without erythema, effusion, bruising, or gross deformity No joint line tenderness.     Diabetic Foot Exam - Simple   Simple Foot Form Diabetic Foot exam was performed with the following findings:  Yes 01/23/2018  9:42 AM  Visual Inspection See comments:  Yes Sensation Testing Intact to touch and monofilament testing bilaterally:  Yes Pulse Check Posterior Tibialis and Dorsalis pulse intact bilaterally:  Yes Comments Patient developing callus on the left lateral forefoot      Assessment and plan:  #Type 2 diabetes Well-controlled on metformin A1c 6.0, no changes   #Diabetic neuropathy With callus formation of the left forefoot, diabetic shoes prescription written  #Hyperlipidemia Repeat labs today, continue Lipitor  #Chronic right knee pain Possible right knee osteoarthritis, x-ray today Offered orthopedics, she will consider    Orders Placed This Encounter  Procedures  . Microalbumin / creatinine urine ratio  . Bayer DCA Hb A1c Waived    Meds ordered this encounter  Medications  . atorvastatin (LIPITOR) 40 MG tablet   Sig: TAKE 1 TABLET (40 MG TOTAL) BY MOUTH DAILY.    Dispense:  90 tablet    Refill:  3  . furosemide (LASIX) 20 MG tablet    Sig: Take 1 tablet (20 mg total) by mouth daily.    Dispense:  90 tablet    Refill:  1  . metFORMIN (GLUCOPHAGE) 500 MG tablet    Sig: TAKE 1 TABLET (500 MG TOTAL) BY MOUTH 2 (TWO) TIMES DAILY WITH A MEAL.    Dispense:  180 tablet    Refill:  3  . amLODipine-olmesartan (AZOR) 5-40 MG tablet    Sig: Take 1 tablet by mouth daily.    Dispense:  90 tablet    Refill:  3    Murtis Sink, MD [facility] 01/23/2018, 9:40 AM

## 2018-01-23 NOTE — Patient Instructions (Signed)
Come see Dr. Nadine Counts in 3 months for follow up diabetes

## 2018-01-24 LAB — CBC WITH DIFFERENTIAL/PLATELET
BASOS: 0 %
Basophils Absolute: 0 10*3/uL (ref 0.0–0.2)
EOS (ABSOLUTE): 0.3 10*3/uL (ref 0.0–0.4)
EOS: 3 %
HEMATOCRIT: 40.5 % (ref 34.0–46.6)
HEMOGLOBIN: 12.8 g/dL (ref 11.1–15.9)
Immature Grans (Abs): 0 10*3/uL (ref 0.0–0.1)
Immature Granulocytes: 0 %
LYMPHS ABS: 2.2 10*3/uL (ref 0.7–3.1)
Lymphs: 24 %
MCH: 25.8 pg — ABNORMAL LOW (ref 26.6–33.0)
MCHC: 31.6 g/dL (ref 31.5–35.7)
MCV: 82 fL (ref 79–97)
MONOCYTES: 5 %
MONOS ABS: 0.5 10*3/uL (ref 0.1–0.9)
Neutrophils Absolute: 6.3 10*3/uL (ref 1.4–7.0)
Neutrophils: 68 %
Platelets: 270 10*3/uL (ref 150–379)
RBC: 4.97 x10E6/uL (ref 3.77–5.28)
RDW: 15.5 % — AB (ref 12.3–15.4)
WBC: 9.3 10*3/uL (ref 3.4–10.8)

## 2018-01-24 LAB — CMP14+EGFR
A/G RATIO: 1.6 (ref 1.2–2.2)
ALBUMIN: 4.1 g/dL (ref 3.5–5.5)
ALK PHOS: 94 IU/L (ref 39–117)
ALT: 22 IU/L (ref 0–32)
AST: 19 IU/L (ref 0–40)
BUN/Creatinine Ratio: 13 (ref 9–23)
BUN: 7 mg/dL (ref 6–24)
Bilirubin Total: 0.7 mg/dL (ref 0.0–1.2)
CALCIUM: 9.3 mg/dL (ref 8.7–10.2)
CO2: 24 mmol/L (ref 20–29)
CREATININE: 0.56 mg/dL — AB (ref 0.57–1.00)
Chloride: 102 mmol/L (ref 96–106)
GFR calc Af Amer: 119 mL/min/{1.73_m2} (ref >59)
GFR calc non Af Amer: 103 mL/min/{1.73_m2} (ref >59)
GLOBULIN, TOTAL: 2.6 g/dL (ref 1.5–4.5)
Glucose: 130 mg/dL — ABNORMAL HIGH (ref 65–99)
POTASSIUM: 4.1 mmol/L (ref 3.5–5.2)
SODIUM: 142 mmol/L (ref 134–144)
Total Protein: 6.7 g/dL (ref 6.0–8.5)

## 2018-01-24 LAB — LIPID PANEL
CHOLESTEROL TOTAL: 109 mg/dL (ref 100–199)
Chol/HDL Ratio: 3.5 ratio (ref 0.0–4.4)
HDL: 31 mg/dL — AB (ref >39)
LDL Calculated: 49 mg/dL (ref 0–99)
TRIGLYCERIDES: 147 mg/dL (ref 0–149)
VLDL CHOLESTEROL CAL: 29 mg/dL (ref 5–40)

## 2018-01-26 ENCOUNTER — Telehealth: Payer: Self-pay | Admitting: Family Medicine

## 2018-01-27 NOTE — Telephone Encounter (Signed)
lmtcb

## 2018-01-29 NOTE — Telephone Encounter (Signed)
Refer to lab notes 

## 2018-01-30 ENCOUNTER — Other Ambulatory Visit (INDEPENDENT_AMBULATORY_CARE_PROVIDER_SITE_OTHER): Payer: BLUE CROSS/BLUE SHIELD

## 2018-01-30 DIAGNOSIS — M1711 Unilateral primary osteoarthritis, right knee: Secondary | ICD-10-CM | POA: Diagnosis not present

## 2018-01-30 DIAGNOSIS — M25561 Pain in right knee: Secondary | ICD-10-CM

## 2018-01-30 DIAGNOSIS — G8929 Other chronic pain: Secondary | ICD-10-CM

## 2018-05-01 ENCOUNTER — Ambulatory Visit: Payer: BLUE CROSS/BLUE SHIELD | Admitting: Family Medicine

## 2018-05-01 ENCOUNTER — Encounter: Payer: Self-pay | Admitting: Family Medicine

## 2018-05-01 VITALS — BP 129/62 | HR 62 | Temp 97.8°F | Ht 65.0 in | Wt 257.0 lb

## 2018-05-01 DIAGNOSIS — I10 Essential (primary) hypertension: Secondary | ICD-10-CM

## 2018-05-01 DIAGNOSIS — R011 Cardiac murmur, unspecified: Secondary | ICD-10-CM | POA: Diagnosis not present

## 2018-05-01 DIAGNOSIS — E1142 Type 2 diabetes mellitus with diabetic polyneuropathy: Secondary | ICD-10-CM

## 2018-05-01 DIAGNOSIS — I152 Hypertension secondary to endocrine disorders: Secondary | ICD-10-CM

## 2018-05-01 DIAGNOSIS — E1159 Type 2 diabetes mellitus with other circulatory complications: Secondary | ICD-10-CM | POA: Diagnosis not present

## 2018-05-01 DIAGNOSIS — E785 Hyperlipidemia, unspecified: Secondary | ICD-10-CM

## 2018-05-01 DIAGNOSIS — Z1211 Encounter for screening for malignant neoplasm of colon: Secondary | ICD-10-CM

## 2018-05-01 DIAGNOSIS — E1169 Type 2 diabetes mellitus with other specified complication: Secondary | ICD-10-CM

## 2018-05-01 DIAGNOSIS — I358 Other nonrheumatic aortic valve disorders: Secondary | ICD-10-CM | POA: Insufficient documentation

## 2018-05-01 NOTE — Progress Notes (Signed)
Subjective: CC: DM2 PCP: Elenora GammaBradshaw, [name], MD WUJ:WJXBJHPI:Carla Mason is a 57 y.o. female presenting to clinic today for:  1. Type 2 Diabetes w/ HTN/ HLD Patient reports that she checks her blood sugar 1-2 times per week, High at home: 120; Low at home: 80, Taking medication(s): Metformin, Azor, Lipitor.  Side effects: None.  She has not yet gotten her diabetic shoes because [place] has been back [place] but she does think the insurance cover this.  Last eye exam: Due Oct.  No retinopathy Last foot exam: UTD > 01/2018 Last A1c: 6.4 in May Nephropathy screen indicated?: on ARB Last flu, zoster and/or pneumovax: PNA vaccine  ROS: denies fever, chills, dizziness, LOC, polyuria, polydipsia, unintended weight loss/gain, foot ulcerations; she reports some lower extremity numbness or tingling in extremities.  No chest pain.  She occasionally also has lower extremity edema.  She wears compression hose during the week for this.  2.  Preventative care Patient reports having had a partial hysterectomy in the past.  She notes that her ovaries were spared but she is unsure as to whether or not she still has her cervix.  She has not had a Pap in a long time.  Denies any abnormal vaginal discharge or bleeding.  She has not had her yearly FOBT card.  She would be amenable to Cologuard.  She denies any hematochezia or melena.   ROS: Per HPI  No Known Allergies Past Medical History:  Diagnosis Date  . Diabetes mellitus without complication (HCC)   . Hyperlipidemia   . Hypertension   . Neuropathy   . Obesity   . Vitamin D deficiency disease     Current Outpatient Medications:  .  amLODipine-olmesartan (AZOR) 5-40 MG tablet, Take 1 tablet by mouth daily., Disp: 90 tablet, Rfl: 3 .  aspirin 81 MG chewable tablet, Chew 81 mg by mouth daily., Disp: , Rfl:  .  atorvastatin (LIPITOR) 40 MG tablet, TAKE 1 TABLET (40 MG TOTAL) BY MOUTH DAILY., Disp: 90 tablet, Rfl: 3 .  betamethasone, augmented, (DIPROLENE)  0.05 % lotion, APPLY TOPICALLY 2 (TWO) TIMES DAILY., Disp: 60 mL, Rfl: 2 .  cholecalciferol (VITAMIN D) 1000 UNITS tablet, Take 2,000 Units by mouth daily., Disp: , Rfl:  .  furosemide (LASIX) 20 MG tablet, Take 1 tablet (20 mg total) by mouth daily., Disp: 90 tablet, Rfl: 1 .  glucose blood (FREESTYLE INSULINX TEST) test strip, TEST ONCE A DAY, Disp: 100 each, Rfl: 3 .  Lancets (FREESTYLE) lancets, Use to check blood sugar daily, Disp: 100 each, Rfl: 2 .  metFORMIN (GLUCOPHAGE) 500 MG tablet, TAKE 1 TABLET (500 MG TOTAL) BY MOUTH 2 (TWO) TIMES DAILY WITH A MEAL., Disp: 180 tablet, Rfl: 3 Social History   Socioeconomic History  . Marital status: Married    Spouse name: Not on file  . Number of children: Not on file  . Years of education: Not on file  . Highest education level: Not on file  Occupational History  . Not on file  Social Needs  . Financial resource strain: Not on file  . Food insecurity:    Worry: Not on file    Inability: Not on file  . Transportation needs:    Medical: Not on file    Non-medical: Not on file  Tobacco Use  . Smoking status: Never Smoker  . Smokeless tobacco: Never Used  Substance and Sexual Activity  . Alcohol use: Not on file  . Drug use: Not on  file  . Sexual activity: Not on file  Lifestyle  . Physical activity:    Days per week: Not on file    Minutes per session: Not on file  . Stress: Not on file  Relationships  . Social connections:    Talks on phone: Not on file    Gets together: Not on file    Attends religious service: Not on file    Active member of club or organization: Not on file    Attends meetings of clubs or organizations: Not on file    Relationship status: Not on file  . Intimate partner violence:    Fear of current or ex partner: Not on file    Emotionally abused: Not on file    Physically abused: Not on file    Forced sexual activity: Not on file  Other Topics Concern  . Not on file  Social History Narrative  . Not  on file   Family History  Problem Relation Age of Onset  . Heart attack Mother   . Cancer Father   . CVA Maternal Grandmother   . Heart attack Maternal Grandfather   . Cancer Sister   . Early death Brother   . Heart attack Sister   . Healthy Sister     Objective: Office vital signs reviewed. BP 129/62   Pulse 62   Temp 97.8 F (36.6 C) (Oral)   Ht 5\' 5"  (1.651 m)   Wt 257 lb (116.6 kg)   BMI 42.77 kg/m   Physical Examination:  General: Awake, alert, obese, No acute distress HEENT: sclera white, MMM; Cardio: regular rate and rhythm, S1S2 heard, 2/6 SEM appreciated at the RSB.  No radiation to carotids Pulm: clear to auscultation bilaterally, no wheezes, rhonchi or rales; normal work of breathing on room air Extremities: warm, well perfused, No edema, cyanosis or clubbing; +2 pulses bilaterally MSK: normla gait and normal station  Assessment/ Plan: 57 y.o. female   1. Newly recognized heart murmur 2/6 systolic ejection murmur noted at the right sternal border with no radiation.  Per her report this is never been noted nor evaluated.  She is not demonstrating any symptoms of valvular disease.  Will refer to cardiology for further evaluation. - Ambulatory referral to Cardiology  2. Hypertension associated with diabetes (HCC) Controlled on current regimen.  No changes made.  3. Type 2 diabetes mellitus with diabetic polyneuropathy, unspecified whether long term insulin use (HCC) A1c was within normal limits previously.  We will plan to recheck this in 3 months.  Encouraged her to obtain a diabetic shoes that she was prescribed.  4. Hyperlipidemia associated with type 2 diabetes mellitus (HCC) Continue Lipitor as directed.  5. Severe obesity (BMI >= 40) (HCC) Healthy lifestyle changes were recommended.  If patient has not been evaluated for OSA, may consider offering this at next visit.  6. Screening for malignant neoplasm of colon We discussed Cologuard as an  alternative to colonoscopy today.  I have placed an order for this.  Patient to send this as soon as she is able. - Cologuard   Orders Placed This Encounter  Procedures  . Cologuard  . Ambulatory referral to Cardiology    Referral Priority:   Routine    Referral Type:   Consultation    Referral Reason:   Specialty Services Required    Requested Specialty:   Cardiology    Number of Visits Requested:   1    Ashly Hulen Skains, DO [facility] [phone]

## 2018-06-14 DIAGNOSIS — Z1211 Encounter for screening for malignant neoplasm of colon: Secondary | ICD-10-CM | POA: Diagnosis not present

## 2018-06-14 DIAGNOSIS — Z1212 Encounter for screening for malignant neoplasm of rectum: Secondary | ICD-10-CM | POA: Diagnosis not present

## 2018-06-19 LAB — COLOGUARD
Cologuard: NEGATIVE
Cologuard: NEGATIVE

## 2018-07-22 DIAGNOSIS — R011 Cardiac murmur, unspecified: Secondary | ICD-10-CM | POA: Diagnosis not present

## 2018-07-22 DIAGNOSIS — E785 Hyperlipidemia, unspecified: Secondary | ICD-10-CM | POA: Diagnosis not present

## 2018-07-22 DIAGNOSIS — E119 Type 2 diabetes mellitus without complications: Secondary | ICD-10-CM | POA: Diagnosis not present

## 2018-07-24 DIAGNOSIS — Z7984 Long term (current) use of oral hypoglycemic drugs: Secondary | ICD-10-CM | POA: Diagnosis not present

## 2018-07-24 DIAGNOSIS — Z794 Long term (current) use of insulin: Secondary | ICD-10-CM | POA: Diagnosis not present

## 2018-07-24 DIAGNOSIS — E119 Type 2 diabetes mellitus without complications: Secondary | ICD-10-CM | POA: Diagnosis not present

## 2018-07-24 LAB — HM DIABETES EYE EXAM

## 2018-07-27 DIAGNOSIS — Z1231 Encounter for screening mammogram for malignant neoplasm of breast: Secondary | ICD-10-CM | POA: Diagnosis not present

## 2018-07-31 ENCOUNTER — Encounter: Payer: Self-pay | Admitting: Family Medicine

## 2018-07-31 DIAGNOSIS — E785 Hyperlipidemia, unspecified: Secondary | ICD-10-CM | POA: Diagnosis not present

## 2018-07-31 DIAGNOSIS — E119 Type 2 diabetes mellitus without complications: Secondary | ICD-10-CM | POA: Diagnosis not present

## 2018-07-31 DIAGNOSIS — R011 Cardiac murmur, unspecified: Secondary | ICD-10-CM | POA: Diagnosis not present

## 2018-08-14 ENCOUNTER — Encounter: Payer: Self-pay | Admitting: *Deleted

## 2018-08-27 ENCOUNTER — Other Ambulatory Visit: Payer: Self-pay | Admitting: Family Medicine

## 2018-08-27 DIAGNOSIS — R609 Edema, unspecified: Secondary | ICD-10-CM

## 2018-09-03 DIAGNOSIS — K7689 Other specified diseases of liver: Secondary | ICD-10-CM | POA: Diagnosis not present

## 2018-09-03 DIAGNOSIS — K838 Other specified diseases of biliary tract: Secondary | ICD-10-CM | POA: Diagnosis not present

## 2018-09-03 DIAGNOSIS — K8020 Calculus of gallbladder without cholecystitis without obstruction: Secondary | ICD-10-CM | POA: Diagnosis not present

## 2018-09-03 DIAGNOSIS — N281 Cyst of kidney, acquired: Secondary | ICD-10-CM | POA: Diagnosis not present

## 2018-09-28 IMAGING — DX DG KNEE 1-2V*R*
2 series · 2 of 2 positions shown · non-contrast
Comparison: None

CLINICAL DATA: Chronic right knee

EXAM:
RIGHT KNEE - 1-2 VIEW

[knee ap]
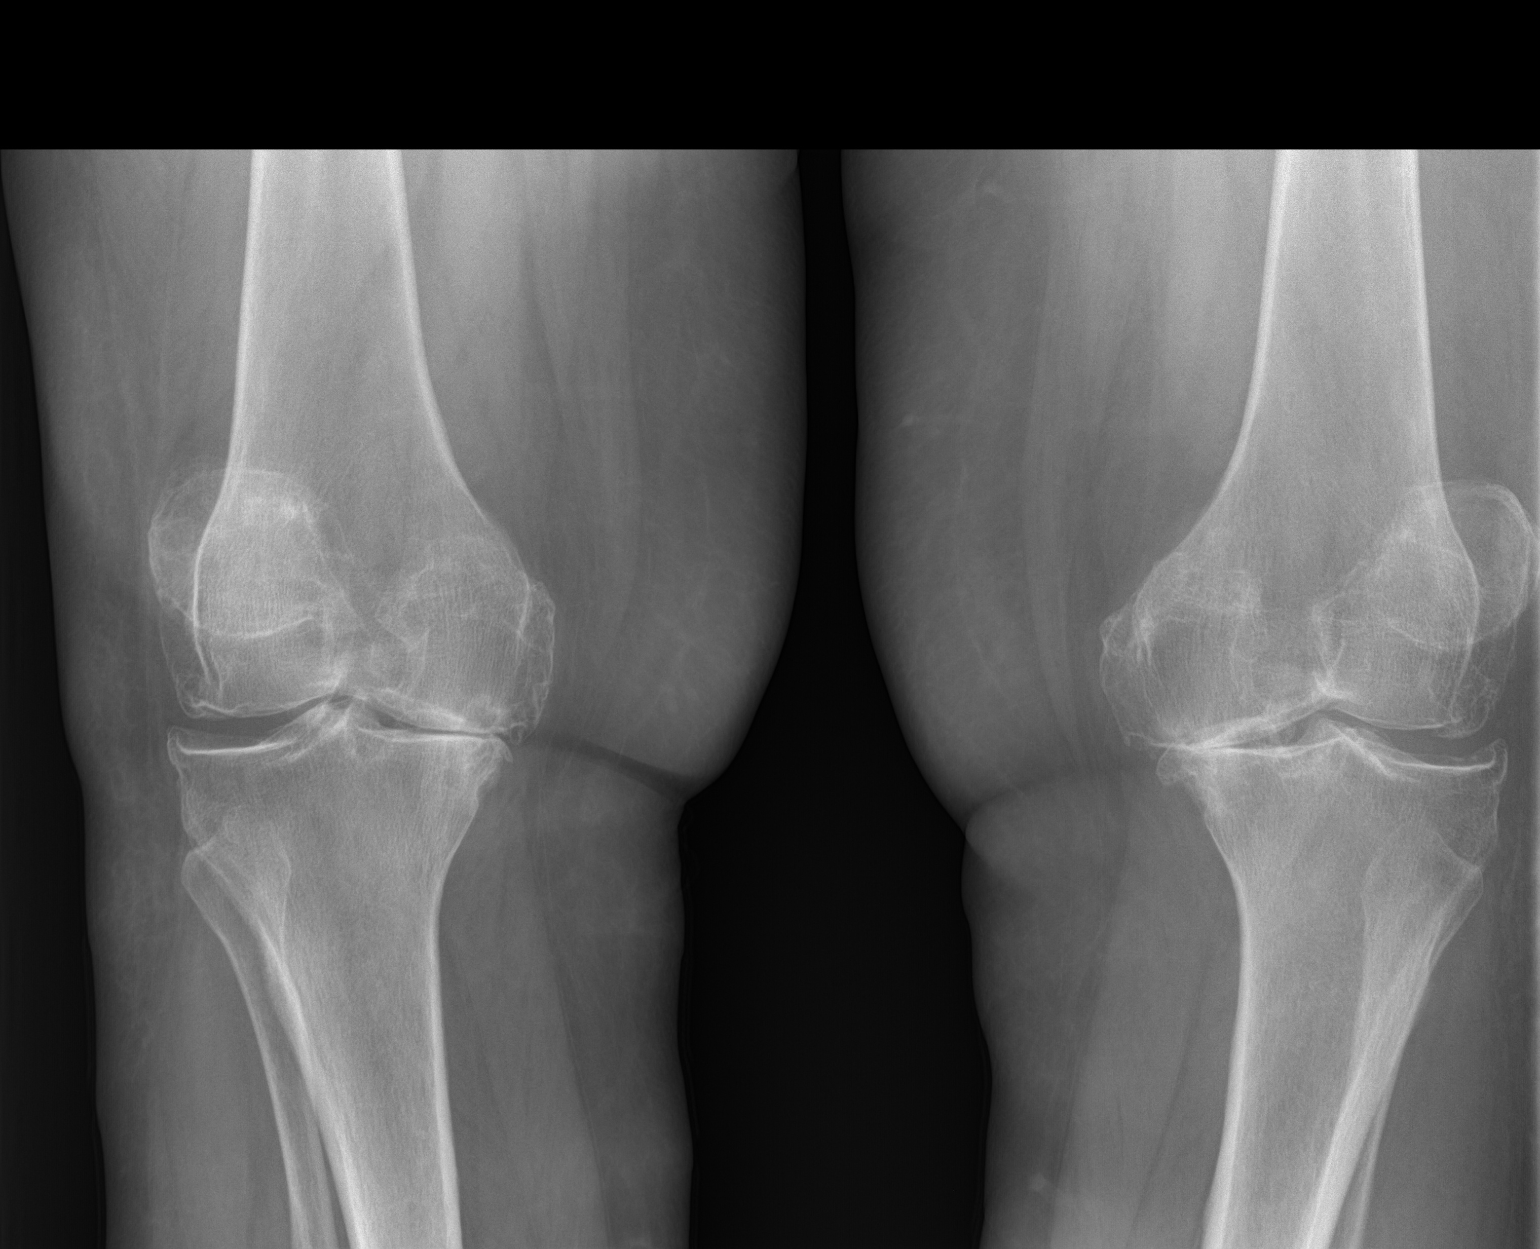

[knee lat]
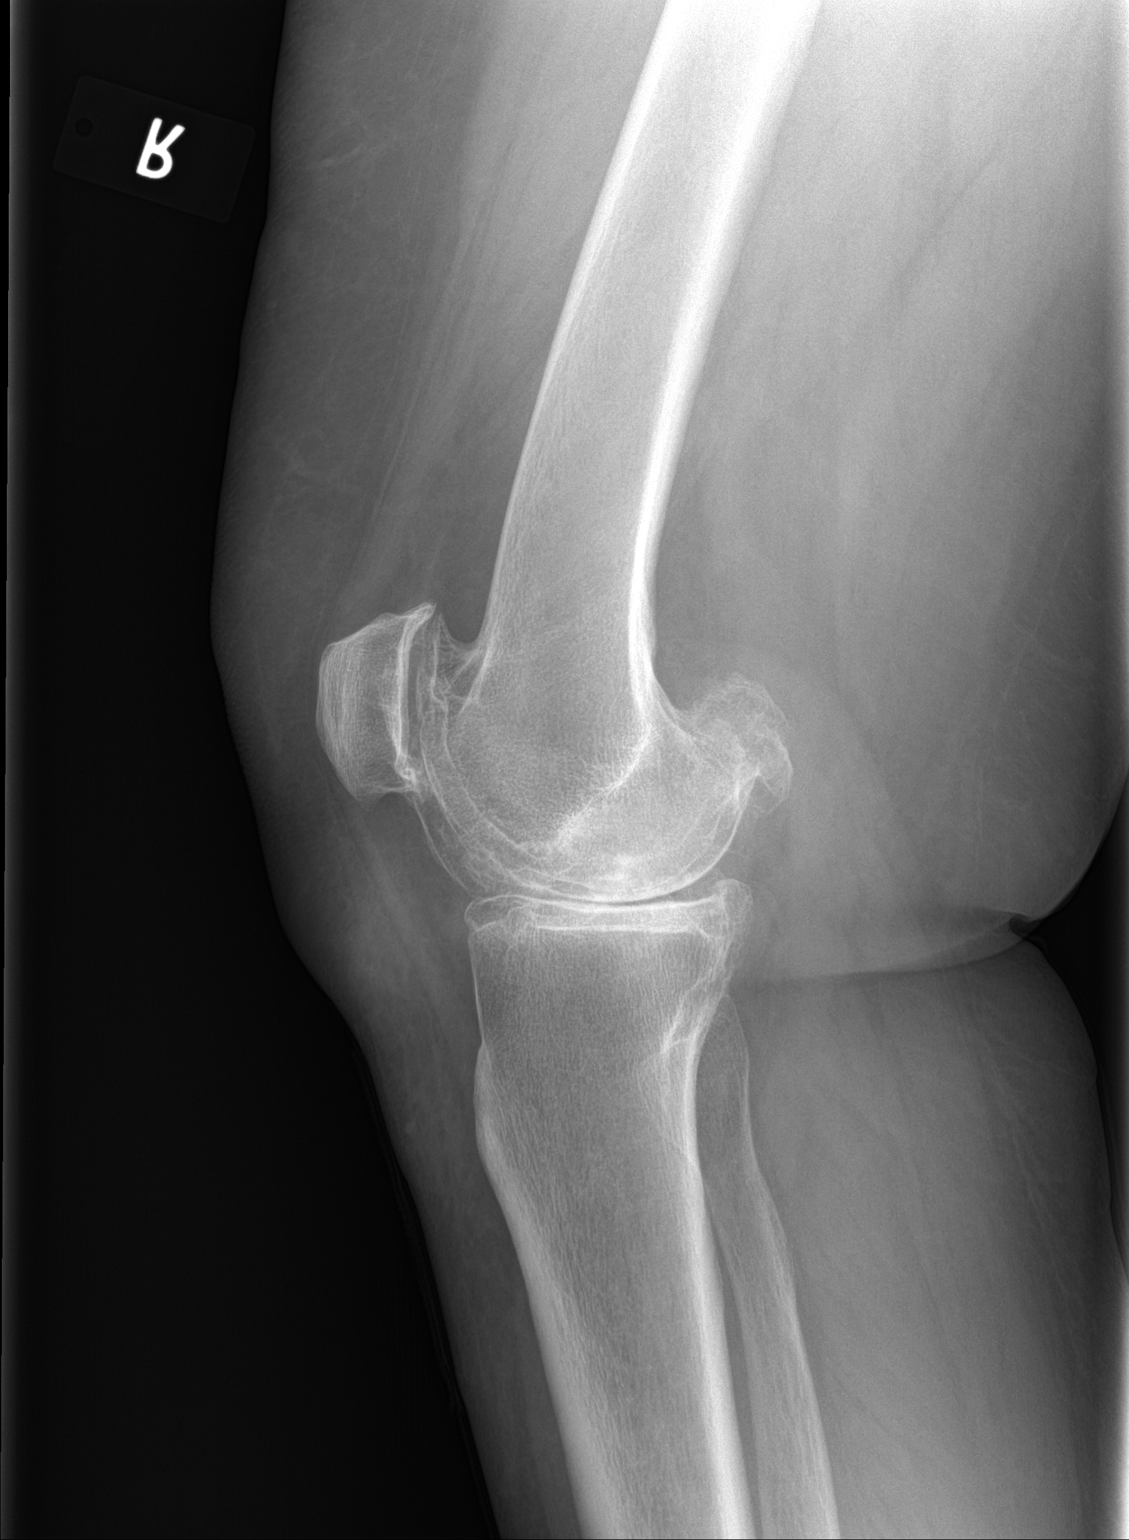

[2 of 2 positions shown; findings below may reference images not displayed]

FINDINGS: Osseous demineralization.

Advanced tricompartmental osteoarthritic changes with joint space
narrowing and spur formation.

No acute fracture, dislocation, or bone destruction.

No knee joint effusion.

AP comparison view of the LEFT knee demonstrates osseous
demineralization and tricompartmental osteoarthritic changes as
well.
IMPRESSION: Tricompartmental osteoarthritic changes of both knees.

## 2018-10-18 ENCOUNTER — Other Ambulatory Visit: Payer: Self-pay | Admitting: Family Medicine

## 2018-10-18 DIAGNOSIS — R609 Edema, unspecified: Secondary | ICD-10-CM

## 2018-10-19 NOTE — Telephone Encounter (Signed)
Last seen 05/01/18

## 2018-11-13 ENCOUNTER — Encounter (INDEPENDENT_AMBULATORY_CARE_PROVIDER_SITE_OTHER): Payer: Self-pay

## 2018-11-13 ENCOUNTER — Ambulatory Visit: Payer: BLUE CROSS/BLUE SHIELD | Admitting: Family Medicine

## 2018-11-13 ENCOUNTER — Encounter: Payer: Self-pay | Admitting: Family Medicine

## 2018-11-13 VITALS — BP 116/71 | HR 79 | Temp 97.2°F | Ht 65.0 in | Wt 259.0 lb

## 2018-11-13 DIAGNOSIS — E1159 Type 2 diabetes mellitus with other circulatory complications: Secondary | ICD-10-CM

## 2018-11-13 DIAGNOSIS — E1142 Type 2 diabetes mellitus with diabetic polyneuropathy: Secondary | ICD-10-CM

## 2018-11-13 DIAGNOSIS — I152 Hypertension secondary to endocrine disorders: Secondary | ICD-10-CM

## 2018-11-13 DIAGNOSIS — E785 Hyperlipidemia, unspecified: Secondary | ICD-10-CM

## 2018-11-13 DIAGNOSIS — M170 Bilateral primary osteoarthritis of knee: Secondary | ICD-10-CM | POA: Diagnosis not present

## 2018-11-13 DIAGNOSIS — I10 Essential (primary) hypertension: Secondary | ICD-10-CM

## 2018-11-13 LAB — BAYER DCA HB A1C WAIVED: HB A1C (BAYER DCA - WAIVED): 6.8 % (ref <7.0)

## 2018-11-13 MED ORDER — GLUCOSE BLOOD VI STRP: ORAL_STRIP | 3 refills | Status: DC

## 2018-11-13 MED ORDER — LANCET DEVICE MISC: 3 refills | Status: DC

## 2018-11-13 MED ORDER — BLOOD GLUCOSE METER KIT: PACK | 0 refills | Status: DC

## 2018-11-13 MED ORDER — AMLODIPINE-OLMESARTAN 5-40 MG PO TABS: 1.0000 | ORAL_TABLET | Freq: Every day | ORAL | 3 refills | Status: DC

## 2018-11-13 MED ORDER — ATORVASTATIN CALCIUM 40 MG PO TABS: ORAL_TABLET | ORAL | 3 refills | Status: DC

## 2018-11-13 MED ORDER — METFORMIN HCL 500 MG PO TABS: ORAL_TABLET | ORAL | 3 refills | Status: DC

## 2018-11-13 NOTE — Progress Notes (Signed)
Subjective: CC: DM2 PCP: [name], DO HER:Carla Mason is a 58 y.o. female presenting to clinic today for:  1. Type 2 Diabetes w/ HTN/ HLD Patient reports that she checks her blood sugar 1-2 times per week. Taking medication(s): Metformin, Azor, Lipitor.  Side effects: None.    Last eye exam: UTD;  No retinopathy Last foot exam: UTD > 01/2018 Last A1c:  Lab Results  Component Value Date   HGBA1C 6.4 01/23/2018   Nephropathy screen indicated?: on ARB Last flu, zoster and/or pneumovax: PNA vaccine declined  ROS: Denies LOC, polyuria, polydipsia, unintended weight loss/gain, foot ulcerations; She has some lower extremity numbness or tingling in extremities.  No chest pain, SOB.   2.  OA knees Patient had bilateral knee x-rays last year and was told that she had arthritis of the knees.  These were reviewed during today's office visit with the patient and the x-rays actually showed severe tricompartmental osteoarthritis with bone-on-bone particularly within the medial compartments of the knee.  She would like referral to orthopedics in [facility] for further evaluation and management of this.   ROS: Per HPI  No Known Allergies Past Medical History:  Diagnosis Date  . Diabetes mellitus without complication ([place])   . Hyperlipidemia   . Hypertension   . Neuropathy   . Obesity   . Vitamin D deficiency disease     Current Outpatient Medications:  .  amLODipine-olmesartan (AZOR) 5-40 MG tablet, Take 1 tablet by mouth daily., Disp: 90 tablet, Rfl: 3 .  aspirin 81 MG chewable tablet, Chew 81 mg by mouth daily., Disp: , Rfl:  .  atorvastatin (LIPITOR) 40 MG tablet, TAKE 1 TABLET (40 MG TOTAL) BY MOUTH DAILY., Disp: 90 tablet, Rfl: 3 .  betamethasone, augmented, (DIPROLENE) 0.05 % lotion, APPLY TOPICALLY 2 (TWO) TIMES DAILY., Disp: 60 mL, Rfl: 2 .  cholecalciferol (VITAMIN D) 1000 UNITS tablet, Take 2,000 Units by mouth daily., Disp: , Rfl:  .  furosemide (LASIX) 20 MG  tablet, TAKE 1 TABLET BY MOUTH EVERY DAY, Disp: 90 tablet, Rfl: 0 .  glucose blood (FREESTYLE INSULINX TEST) test strip, TEST ONCE A DAY, Disp: 100 each, Rfl: 3 .  Lancets (FREESTYLE) lancets, Use to check blood sugar daily, Disp: 100 each, Rfl: 2 .  metFORMIN (GLUCOPHAGE) 500 MG tablet, TAKE 1 TABLET (500 MG TOTAL) BY MOUTH 2 (TWO) TIMES DAILY WITH A MEAL., Disp: 180 tablet, Rfl: 3 Social History   Socioeconomic History  . Marital status: Married    Spouse name: Not on file  . Number of children: Not on file  . Years of education: Not on file  . Highest education level: Not on file  Occupational History  . Not on file  Social Needs  . Financial resource strain: Not on file  . Food insecurity:    Worry: Not on file    Inability: Not on file  . Transportation needs:    Medical: Not on file    Non-medical: Not on file  Tobacco Use  . Smoking status: Never Smoker  . Smokeless tobacco: Never Used  Substance and Sexual Activity  . Alcohol use: Not on file  . Drug use: Not on file  . Sexual activity: Not on file  Lifestyle  . Physical activity:    Days per week: Not on file    Minutes per session: Not on file  . Stress: Not on file  Relationships  . Social connections:    Talks on phone: Not on  file    Gets together: Not on file    Attends religious service: Not on file    Active member of club or organization: Not on file    Attends meetings of clubs or organizations: Not on file    Relationship status: Not on file  . Intimate partner violence:    Fear of current or ex partner: Not on file    Emotionally abused: Not on file    Physically abused: Not on file    Forced sexual activity: Not on file  Other Topics Concern  . Not on file  Social History Narrative  . Not on file   Family History  Problem Relation Age of Onset  . Heart attack Mother   . Cancer Father   . CVA Maternal Grandmother   . Heart attack Maternal Grandfather   . Cancer Sister   . Early death  Brother   . Heart attack Sister   . Healthy Sister     Objective: Office vital signs reviewed. BP 116/71   Pulse 79   Temp (!) 97.2 F (36.2 C) (Oral)   Ht '5\' 5"'  (1.651 m)   Wt 259 lb (117.5 kg)   BMI 43.10 kg/m   Physical Examination:  General: Awake, alert, obese, No acute distress HEENT: sclera white, MMM Cardio: regular rate and rhythm, S1S2 heard, 2/6 SEM appreciated at bilateral SB Pulm: clear to auscultation bilaterally, no wheezes, rhonchi or rales; normal work of breathing on room air Extremities: warm, well perfused, No edema, cyanosis or clubbing; +2 pulses bilaterally MSK: stiff gait and station  Assessment/ Plan: 58 y.o. female   1. Type 2 diabetes mellitus with diabetic polyneuropathy, unspecified whether long term insulin use ([place]) Under excellent control today with A1c of 6.8.  Continue metformin p.o. twice daily.  I have renewed her diabetic supplies before once daily blood sugar testing.  Follow-up in 6 months, sooner if needed - Bayer DCA Hb A1c Waived - metFORMIN (GLUCOPHAGE) 500 MG tablet; TAKE 1 TABLET (500 MG TOTAL) BY MOUTH 2 (TWO) TIMES DAILY WITH A MEAL.  Dispense: 180 tablet; Refill: 3 - blood glucose meter kit and supplies; Dispense based on patient and insurance preference. Use up to one time daily as directed. (FOR ICD-10 E10.9, E11.9).  Dispense: 1 each; Refill: 0 - glucose blood test strip; UAD to test BG once daily  Dispense: 100 each; Refill: 3 - Lancet Device MISC; Use to test BG once daily  Dispense: 100 each; Refill: 3  2. Hyperlipidemia, unspecified hyperlipidemia type Continue Lipitor.  She is due for fasting labs at next visit. - atorvastatin (LIPITOR) 40 MG tablet; TAKE 1 TABLET (40 MG TOTAL) BY MOUTH DAILY.  Dispense: 90 tablet; Refill: 3  3. Hypertension associated with diabetes ([place]) Under good control.  Continue current regimen.  Refill sent. - amLODipine-olmesartan (AZOR) 5-40 MG tablet; Take 1 tablet by mouth daily.  Dispense: 90  tablet; Refill: 3  4. Primary osteoarthritis of both knees Referral placed to orthopedic surgery for further evaluation and management. - Ambulatory referral to Orthopedic Surgery   Orders Placed This Encounter  Procedures  . Bayer DCA Hb A1c Waived   Meds ordered this encounter  Medications  . amLODipine-olmesartan (AZOR) 5-40 MG tablet    Sig: Take 1 tablet by mouth daily.    Dispense:  90 tablet    Refill:  3  . atorvastatin (LIPITOR) 40 MG tablet    Sig: TAKE 1 TABLET (40 MG TOTAL) BY MOUTH DAILY.  Dispense:  90 tablet    Refill:  3  . metFORMIN (GLUCOPHAGE) 500 MG tablet    Sig: TAKE 1 TABLET (500 MG TOTAL) BY MOUTH 2 (TWO) TIMES DAILY WITH A MEAL.    Dispense:  180 tablet    Refill:  3  . blood glucose meter kit and supplies    Sig: Dispense based on patient and insurance preference. Use up to one time daily as directed. (FOR ICD-10 E10.9, E11.9).    Dispense:  1 each    Refill:  0    Order Specific Question:   Number of strips    Answer:   100    Order Specific Question:   Number of lancets    Answer:   100  . glucose blood test strip    Sig: UAD to test BG once daily    Dispense:  100 each    Refill:  3  . Lancet Device MISC    Sig: Use to test BG once daily    Dispense:  100 each    Refill:  [place], [place] [phone]

## 2018-11-13 NOTE — Patient Instructions (Signed)
I was unable to see the heart ultrasound results.  Keep the follow up with the heart specialist and ask him to send me notes.    Mammogram could be seen and has been added to your record.

## 2018-11-30 DIAGNOSIS — M25562 Pain in left knee: Secondary | ICD-10-CM | POA: Diagnosis not present

## 2018-11-30 DIAGNOSIS — M25561 Pain in right knee: Secondary | ICD-10-CM | POA: Diagnosis not present

## 2019-01-10 ENCOUNTER — Other Ambulatory Visit: Payer: Self-pay | Admitting: Family Medicine

## 2019-01-10 DIAGNOSIS — R609 Edema, unspecified: Secondary | ICD-10-CM

## 2019-02-26 DIAGNOSIS — L0291 Cutaneous abscess, unspecified: Secondary | ICD-10-CM | POA: Diagnosis not present

## 2019-02-26 DIAGNOSIS — Z6841 Body Mass Index (BMI) 40.0 and over, adult: Secondary | ICD-10-CM | POA: Diagnosis not present

## 2019-03-25 ENCOUNTER — Other Ambulatory Visit: Payer: Self-pay

## 2019-03-25 ENCOUNTER — Ambulatory Visit (INDEPENDENT_AMBULATORY_CARE_PROVIDER_SITE_OTHER): Payer: BC Managed Care – PPO | Admitting: Physician Assistant

## 2019-03-25 ENCOUNTER — Encounter: Payer: Self-pay | Admitting: Physician Assistant

## 2019-03-25 VITALS — BP 127/82 | HR 89 | Temp 98.4°F | Ht 65.0 in | Wt 258.0 lb

## 2019-03-25 DIAGNOSIS — L02219 Cutaneous abscess of trunk, unspecified: Secondary | ICD-10-CM

## 2019-03-25 DIAGNOSIS — L661 Lichen planopilaris: Secondary | ICD-10-CM | POA: Diagnosis not present

## 2019-03-25 MED ORDER — CLINDAMYCIN HCL 300 MG PO CAPS: 300.0000 mg | ORAL_CAPSULE | Freq: Three times a day (TID) | ORAL | 0 refills | Status: DC

## 2019-03-25 MED ORDER — BETAMETHASONE DIPROPIONATE AUG 0.05 % EX LOTN: TOPICAL_LOTION | CUTANEOUS | 2 refills | Status: DC

## 2019-03-25 NOTE — Patient Instructions (Signed)

## 2019-03-29 ENCOUNTER — Encounter: Payer: Self-pay | Admitting: Physician Assistant

## 2019-03-29 NOTE — Progress Notes (Signed)
BP 127/82   Pulse 89   Temp 98.4 F (36.9 C) (Oral)   Ht '5\' 5"'  (1.651 m)   Wt 258 lb (117 kg)   BMI 42.93 kg/m    Subjective:    Patient ID: Carla Mason, female    DOB: September 25, 1960, 58 y.o.   MRN: [id number]  HPI: Carla Mason is a 58 y.o. female presenting on 03/25/2019 for Recurrent Skin Infections (vaginal area)  This patient has a boil on the pubic mons.  She said it started out on the right side and did end up draining but has continued to enlarge and was red and moved across the pubic mons.  She has not had fever.  She has had some lesser lesions but has never had a boil like this before.  They usually will resolve on their own.  Patient also has lichen planus of the vulvar area.  She does need a refill on her steroid cream.  We will send this in for her.  We are going to plan to have her come back to have the area rechecked by myself or Delight Ovens, [place].  She saw the patient with me on today's date.   Past Medical History:  Diagnosis Date  . Diabetes mellitus without complication ([place])   . Hyperlipidemia   . Hypertension   . Neuropathy   . Obesity   . Vitamin D deficiency disease    Relevant past medical, surgical, family and social history reviewed and updated as indicated. Interim medical history since our last visit reviewed. Allergies and medications reviewed and updated. DATA REVIEWED: CHART IN EPIC  Family History reviewed for pertinent findings.  Review of Systems  Constitutional: Negative.   HENT: Negative.   Eyes: Negative.   Respiratory: Negative.   Gastrointestinal: Negative.   Genitourinary: Negative.   Skin: Positive for color change and wound.    Allergies as of 03/25/2019   No Known Allergies     Medication List       Accurate as of March 25, 2019 11:59 PM. If you have any questions, ask your nurse or doctor.        amLODipine-olmesartan 5-40 MG tablet Commonly known as: AZOR Take 1 tablet by mouth daily.   aspirin 81 MG  chewable tablet Chew 81 mg by mouth daily.   atorvastatin 40 MG tablet Commonly known as: LIPITOR TAKE 1 TABLET (40 MG TOTAL) BY MOUTH DAILY.   betamethasone (augmented) 0.05 % lotion Commonly known as: DIPROLENE APPLY TOPICALLY 2 (TWO) TIMES DAILY.   blood glucose meter kit and supplies Dispense based on patient and insurance preference. Use up to one time daily as directed. (FOR ICD-10 E10.9, E11.9).   cholecalciferol 1000 units tablet Commonly known as: VITAMIN D Take 2,000 Units by mouth daily.   clindamycin 300 MG capsule Commonly known as: Cleocin Take 1 capsule (300 mg total) by mouth 3 (three) times daily. Started by: Terald Sleeper, PA-C   furosemide 20 MG tablet Commonly known as: LASIX TAKE 1 TABLET BY MOUTH EVERY DAY   glucose blood test strip UAD to test BG once daily   Lancet Device Misc Use to test BG once daily   metFORMIN 500 MG tablet Commonly known as: GLUCOPHAGE TAKE 1 TABLET (500 MG TOTAL) BY MOUTH 2 (TWO) TIMES DAILY WITH A MEAL.          Objective:    BP 127/82   Pulse 89   Temp 98.4 F (36.9 C) (Oral)  Ht '5\' 5"'  (1.651 m)   Wt 258 lb (117 kg)   BMI 42.93 kg/m   No Known Allergies  Wt Readings from Last 3 Encounters:  03/25/19 258 lb (117 kg)  11/13/18 259 lb (117.5 kg)  05/01/18 257 lb (116.6 kg)    Physical Exam Constitutional:      Appearance: She is well-developed.  HENT:     Head: Normocephalic and atraumatic.  Eyes:     Conjunctiva/sclera: Conjunctivae normal.     Pupils: Pupils are equal, round, and reactive to light.  Cardiovascular:     Rate and Rhythm: Normal rate and regular rhythm.  Pulmonary:     Effort: Pulmonary effort is normal.  Skin:    General: Skin is warm and dry.     Findings: Abscess and erythema present. No rash.          Comments: Firm redness and dried opening to original abscess.  The area is nonfluctuant and spreading across the pubic mons.  Is very warm to the touch.  Neurological:      Mental Status: She is alert and oriented to person, place, and time.     Deep Tendon Reflexes: Reflexes are normal and symmetric.  Psychiatric:        Behavior: Behavior normal.     Results for orders placed or performed in visit on 11/13/18  Bayer DCA Hb A1c Waived  Result Value Ref Range   HB A1C (BAYER DCA - WAIVED) 6.8 <7.0 %      Assessment & Plan:   1. Lichen planopilaris of vulva - betamethasone, augmented, (DIPROLENE) 0.05 % lotion; APPLY TOPICALLY 2 (TWO) TIMES DAILY.  Dispense: 60 mL; Refill: 2  2. Abscess of pubic region - clindamycin (CLEOCIN) 300 MG capsule; Take 1 capsule (300 mg total) by mouth 3 (three) times daily.  Dispense: 42 capsule; Refill: 0   Continue all other maintenance medications as listed above.  Follow up plan: Return in about 2 weeks (around 04/08/2019) for Britney/Masyn Fullam here.  Educational handout given for abscess  Terald Sleeper PA-C [facility] [address]  [place], [place] [address] [phone]   03/29/2019, 7:57 AM

## 2019-04-06 ENCOUNTER — Telehealth: Payer: Self-pay | Admitting: Family Medicine

## 2019-04-07 ENCOUNTER — Ambulatory Visit (INDEPENDENT_AMBULATORY_CARE_PROVIDER_SITE_OTHER): Payer: BC Managed Care – PPO | Admitting: Family Medicine

## 2019-04-07 ENCOUNTER — Other Ambulatory Visit: Payer: Self-pay

## 2019-04-07 ENCOUNTER — Encounter: Payer: Self-pay | Admitting: Family Medicine

## 2019-04-07 ENCOUNTER — Ambulatory Visit: Payer: BC Managed Care – PPO | Admitting: Family Medicine

## 2019-04-07 VITALS — BP 130/80 | HR 86 | Temp 97.8°F | Ht 65.0 in | Wt 261.8 lb

## 2019-04-07 DIAGNOSIS — L02219 Cutaneous abscess of trunk, unspecified: Secondary | ICD-10-CM

## 2019-04-07 MED ORDER — CLINDAMYCIN HCL 300 MG PO CAPS: 300.0000 mg | ORAL_CAPSULE | Freq: Three times a day (TID) | ORAL | 0 refills | Status: DC

## 2019-04-07 NOTE — Progress Notes (Signed)
BP 130/80   Pulse 86   Temp 97.8 F (36.6 C) (Oral)   Ht '5\' 5"'  (1.651 m)   Wt 261 lb 12.8 oz (118.8 kg)   BMI 43.57 kg/m    Subjective:   Patient ID: [name], female    DOB: [birth year], 58 y.o.   MRN: [id number]  HPI: ROMONDA PARKER is a 58 y.o. female presenting on 04/07/2019 for Recurrent Skin Infections (2 week re check - groin area)   HPI Patient is having recurrent skin infections and is coming in for recheck of it.  It was 2 weeks ago that she was seen in our office and that is why she is coming in for 2-week recheck.  She says she is feeling a lot better and she just finished her last dose of clindamycin this afternoon.  She denies any fevers or chills.  She denies any more drainage.  She does not have any pain down in the region and feels like they are looking a lot better.  Patient was seen with Delight Ovens was visualized it previously and says that it is worlds better than what it was.  Relevant past medical, surgical, family and social history reviewed and updated as indicated. Interim medical history since our last visit reviewed. Allergies and medications reviewed and updated.  Review of Systems  Constitutional: Negative for chills and fever.  Eyes: Negative for redness and visual disturbance.  Respiratory: Negative for chest tightness and shortness of breath.   Cardiovascular: Negative for chest pain and leg swelling.  Musculoskeletal: Negative for back pain and gait problem.  Skin: Negative for color change, rash and wound.  Neurological: Negative for light-headedness and headaches.  Psychiatric/Behavioral: Negative for agitation and behavioral problems.  All other systems reviewed and are negative.   Per HPI unless specifically indicated above   Allergies as of 04/07/2019   No Known Allergies     Medication List       Accurate as of April 07, 2019  4:08 PM. If you have any questions, ask your nurse or doctor.        amLODipine-olmesartan 5-40  MG tablet Commonly known as: AZOR Take 1 tablet by mouth daily.   aspirin 81 MG chewable tablet Chew 81 mg by mouth daily.   atorvastatin 40 MG tablet Commonly known as: LIPITOR TAKE 1 TABLET (40 MG TOTAL) BY MOUTH DAILY.   betamethasone (augmented) 0.05 % lotion Commonly known as: DIPROLENE APPLY TOPICALLY 2 (TWO) TIMES DAILY.   blood glucose meter kit and supplies Dispense based on patient and insurance preference. Use up to one time daily as directed. (FOR ICD-10 E10.9, E11.9).   cholecalciferol 1000 units tablet Commonly known as: VITAMIN D Take 2,000 Units by mouth daily.   clindamycin 300 MG capsule Commonly known as: Cleocin Take 1 capsule (300 mg total) by mouth 3 (three) times daily.   furosemide 20 MG tablet Commonly known as: LASIX TAKE 1 TABLET BY MOUTH EVERY DAY   glucose blood test strip UAD to test BG once daily   Lancet Device Misc Use to test BG once daily   metFORMIN 500 MG tablet Commonly known as: GLUCOPHAGE TAKE 1 TABLET (500 MG TOTAL) BY MOUTH 2 (TWO) TIMES DAILY WITH A MEAL.        Objective:   BP 130/80   Pulse 86   Temp 97.8 F (36.6 C) (Oral)   Ht '5\' 5"'  (1.651 m)   Wt 261 lb 12.8 oz (118.8 kg)  BMI 43.57 kg/m   Wt Readings from Last 3 Encounters:  04/07/19 261 lb 12.8 oz (118.8 kg)  03/25/19 258 lb (117 kg)  11/13/18 259 lb (117.5 kg)    Physical Exam Vitals signs and nursing note reviewed.  Constitutional:      General: She is not in acute distress.    Appearance: She is well-developed. She is not diaphoretic.  Eyes:     Conjunctiva/sclera: Conjunctivae normal.  Skin:    General: Skin is warm and dry.     Findings: Lesion (Right mons pubis, small firm area where the site used to be, no erythema or tenderness or drainage.) present. No rash.  Neurological:     Mental Status: She is alert and oriented to person, place, and time.     Coordination: Coordination normal.  Psychiatric:        Behavior: Behavior normal.        Assessment & Plan:   Problem List Items Addressed This Visit    None    Visit Diagnoses    Abscess of pubic region    -  Primary   Relevant Medications   clindamycin (CLEOCIN) 300 MG capsule      Patient's abscess is almost entirely cleared up, there is no erythema but there still is a small firm nodule on the 1 part, will do 3 more days of antibiotics and then she will follow-up as needed Follow up plan: Return if symptoms worsen or fail to improve.  Counseling provided for all of the vaccine components No orders of the defined types were placed in this encounter.   Caryl Pina, MD [facility] 04/07/2019, 4:08 PM

## 2019-04-08 ENCOUNTER — Other Ambulatory Visit: Payer: Self-pay | Admitting: Family Medicine

## 2019-04-08 DIAGNOSIS — R609 Edema, unspecified: Secondary | ICD-10-CM

## 2019-05-14 ENCOUNTER — Ambulatory Visit (INDEPENDENT_AMBULATORY_CARE_PROVIDER_SITE_OTHER): Payer: BC Managed Care – PPO | Admitting: Family Medicine

## 2019-05-14 DIAGNOSIS — G8929 Other chronic pain: Secondary | ICD-10-CM

## 2019-05-14 DIAGNOSIS — E1169 Type 2 diabetes mellitus with other specified complication: Secondary | ICD-10-CM | POA: Diagnosis not present

## 2019-05-14 DIAGNOSIS — R609 Edema, unspecified: Secondary | ICD-10-CM

## 2019-05-14 DIAGNOSIS — E1142 Type 2 diabetes mellitus with diabetic polyneuropathy: Secondary | ICD-10-CM | POA: Diagnosis not present

## 2019-05-14 DIAGNOSIS — E1159 Type 2 diabetes mellitus with other circulatory complications: Secondary | ICD-10-CM

## 2019-05-14 DIAGNOSIS — M25561 Pain in right knee: Secondary | ICD-10-CM

## 2019-05-14 DIAGNOSIS — E785 Hyperlipidemia, unspecified: Secondary | ICD-10-CM

## 2019-05-14 DIAGNOSIS — I10 Essential (primary) hypertension: Secondary | ICD-10-CM

## 2019-05-14 DIAGNOSIS — I152 Hypertension secondary to endocrine disorders: Secondary | ICD-10-CM

## 2019-05-14 MED ORDER — FUROSEMIDE 20 MG PO TABS: 20.0000 mg | ORAL_TABLET | Freq: Every day | ORAL | 1 refills | Status: DC

## 2019-05-14 NOTE — Progress Notes (Signed)
Telephone visit  Subjective: CC: DM2, HLD, HTN PCP: [name], DO CBJ:SEGBT Carla Mason is a 58 y.o. female calls for telephone consult today. Patient provides verbal consent for consult held via phone.  Location of patient: home Location of provider: [facility] Others present for call: none  1. Type 2 Diabetes w/ HTN, HLD:  Patient reports that she has not been faithful in checking her blood sugars or blood pressures.  She reports that she feels fine and denies polydipsia, polyuria, visual disturbance, chest pain, shortness of breath.  Lower extremity edema is well controlled with furosemide which she uses daily.  She is compliant with her metformin twice daily as well as her Azor 5-40.  Last eye exam: Due.  She is unable to secure an appointment with my eye doctor but is going to try the doctor at [facility] Last foot exam: Due Last A1c:  Lab Results  Component Value Date   HGBA1C 6.8 11/13/2018   Nephropathy screen indicated?:  On ARB Last flu, zoster and/or pneumovax: Needs pneumococcal.  She will have influenza shot administered at work Immunization History  Administered Date(s) Administered  . Influenza-Unspecified 06/23/2017  . Td 05/15/2011  . Tdap 05/15/2011    2. Knee pain She notes that after her visit in February she did follow-up with the orthopedist and was told that she had bone-on-bone in the knees.  She was not a good candidate for corticosteroid injections because he did not think that they would be helpful.  She was not offered Visco supplementation.  She was told that surgery would likely be the best option for her but she is not entirely sure that she wants to go through with surgery yet.  She is currently living with her sister while she is waiting on a house to be built and notes that she has carpet there.  She worries that this will hinder her ability to rehabilitate.  No Known Allergies Past Medical History:  Diagnosis Date  . Diabetes mellitus without  complication ([place])   . Hyperlipidemia   . Hypertension   . Neuropathy   . Obesity   . Vitamin D deficiency disease     Current Outpatient Medications:  .  amLODipine-olmesartan (AZOR) 5-40 MG tablet, Take 1 tablet by mouth daily., Disp: 90 tablet, Rfl: 3 .  aspirin 81 MG chewable tablet, Chew 81 mg by mouth daily., Disp: , Rfl:  .  atorvastatin (LIPITOR) 40 MG tablet, TAKE 1 TABLET (40 MG TOTAL) BY MOUTH DAILY., Disp: 90 tablet, Rfl: 3 .  betamethasone, augmented, (DIPROLENE) 0.05 % lotion, APPLY TOPICALLY 2 (TWO) TIMES DAILY., Disp: 60 mL, Rfl: 2 .  blood glucose meter kit and supplies, Dispense based on patient and insurance preference. Use up to one time daily as directed. (FOR ICD-10 E10.9, E11.9)., Disp: 1 each, Rfl: 0 .  cholecalciferol (VITAMIN D) 1000 UNITS tablet, Take 2,000 Units by mouth daily., Disp: , Rfl:  .  clindamycin (CLEOCIN) 300 MG capsule, Take 1 capsule (300 mg total) by mouth 3 (three) times daily., Disp: 9 capsule, Rfl: 0 .  furosemide (LASIX) 20 MG tablet, TAKE 1 TABLET BY MOUTH EVERY DAY, Disp: 90 tablet, Rfl: 0 .  glucose blood test strip, UAD to test BG once daily, Disp: 100 each, Rfl: 3 .  Lancet Device MISC, Use to test BG once daily, Disp: 100 each, Rfl: 3 .  metFORMIN (GLUCOPHAGE) 500 MG tablet, TAKE 1 TABLET (500 MG TOTAL) BY MOUTH 2 (TWO) TIMES DAILY WITH A MEAL.,  Disp: 180 tablet, Rfl: 3  Assessment/ Plan: 58 y.o. female   1. Type 2 diabetes mellitus with diabetic polyneuropathy, unspecified whether long term insulin use ([place]) She will come in for labs in 2 weeks - Bayer DCA Hb A1c Waived; Future  2. Hypertension associated with diabetes ([place]) Stable.  Continue Azor - CMP14+EGFR; Future  3. Hyperlipidemia associated with type 2 diabetes mellitus ([place]) Due for fasting lipid panel.  This is been placed.  Continue statin - CMP14+EGFR; Future - Lipid panel; Future  4. Chronic pain of right knee She is seeing orthopedics and the plan is for possible  surgery but she is waiting on this as she is not totally sure that she wants to have it done.  She will continue to follow-up with me as needed for this issue  5. Peripheral edema Stable - furosemide (LASIX) 20 MG tablet; Take 1 tablet (20 mg total) by mouth daily.  Dispense: 90 tablet; Refill: 1   Start time: 3:44pm End time: 3:54pm  Total time spent on patient care (including telephone call/ virtual visit): 15 minutes  [place], [place] [phone]

## 2019-05-27 ENCOUNTER — Ambulatory Visit: Payer: BC Managed Care – PPO | Admitting: Family Medicine

## 2019-05-27 ENCOUNTER — Other Ambulatory Visit: Payer: Self-pay

## 2019-05-27 ENCOUNTER — Encounter: Payer: Self-pay | Admitting: Family Medicine

## 2019-05-27 VITALS — BP 138/88 | HR 63 | Temp 97.1°F | Ht 65.0 in | Wt 263.0 lb

## 2019-05-27 DIAGNOSIS — E1142 Type 2 diabetes mellitus with diabetic polyneuropathy: Secondary | ICD-10-CM | POA: Diagnosis not present

## 2019-05-27 DIAGNOSIS — E785 Hyperlipidemia, unspecified: Secondary | ICD-10-CM | POA: Diagnosis not present

## 2019-05-27 DIAGNOSIS — I152 Hypertension secondary to endocrine disorders: Secondary | ICD-10-CM

## 2019-05-27 DIAGNOSIS — E1169 Type 2 diabetes mellitus with other specified complication: Secondary | ICD-10-CM | POA: Diagnosis not present

## 2019-05-27 DIAGNOSIS — L0291 Cutaneous abscess, unspecified: Secondary | ICD-10-CM | POA: Diagnosis not present

## 2019-05-27 DIAGNOSIS — I10 Essential (primary) hypertension: Secondary | ICD-10-CM

## 2019-05-27 DIAGNOSIS — E1159 Type 2 diabetes mellitus with other circulatory complications: Secondary | ICD-10-CM

## 2019-05-27 LAB — BAYER DCA HB A1C WAIVED: HB A1C (BAYER DCA - WAIVED): 7.3 % — ABNORMAL HIGH (ref <7.0)

## 2019-05-27 MED ORDER — DOXYCYCLINE HYCLATE 100 MG PO TABS: 100.0000 mg | ORAL_TABLET | Freq: Two times a day (BID) | ORAL | 0 refills | Status: AC

## 2019-05-27 NOTE — Progress Notes (Signed)
Assessment & Plan:  1. Abscess - Education provided on skin abscesses. Encouraged warm compresses.  - doxycycline (VIBRA-TABS) 100 MG tablet; Take 1 tablet (100 mg total) by mouth 2 (two) times daily for 10 days. 1 po bid  Dispense: 20 tablet; Refill: 0   Follow up plan: Return if symptoms worsen or fail to improve.  Hendricks Limes, MSN, APRN, FNP-C [facility]  Subjective:   Patient ID: Carla Mason, female    DOB: 1961-06-02, 58 y.o.   MRN: [id number]  HPI: Carla Mason is a 58 y.o. female presenting on 05/27/2019 for Recurrent Skin Infections  Patient presents today with concerns about a reoccurring skin infection. Patient was treated for an abscess in her pubic region two months ago. She reports it has come back in the same area as of two days ago. She has not tried to squeeze it like she did her last one. No home treatments.    ROS: Negative unless specifically indicated above in HPI.   Relevant past medical history reviewed and updated as indicated.   Allergies and medications reviewed and updated.   Current Outpatient Medications:    amLODipine-olmesartan (AZOR) 5-40 MG tablet, Take 1 tablet by mouth daily., Disp: 90 tablet, Rfl: 3   aspirin 81 MG chewable tablet, Chew 81 mg by mouth daily., Disp: , Rfl:    atorvastatin (LIPITOR) 40 MG tablet, TAKE 1 TABLET (40 MG TOTAL) BY MOUTH DAILY., Disp: 90 tablet, Rfl: 3   betamethasone, augmented, (DIPROLENE) 0.05 % lotion, APPLY TOPICALLY 2 (TWO) TIMES DAILY., Disp: 60 mL, Rfl: 2   blood glucose meter kit and supplies, Dispense based on patient and insurance preference. Use up to one time daily as directed. (FOR ICD-10 E10.9, E11.9)., Disp: 1 each, Rfl: 0   cholecalciferol (VITAMIN D) 1000 UNITS tablet, Take 2,000 Units by mouth daily., Disp: , Rfl:    furosemide (LASIX) 20 MG tablet, Take 1 tablet (20 mg total) by mouth daily., Disp: 90 tablet, Rfl: 1   glucose blood test strip, UAD to test BG  once daily, Disp: 100 each, Rfl: 3   Lancet Device MISC, Use to test BG once daily, Disp: 100 each, Rfl: 3   metFORMIN (GLUCOPHAGE) 500 MG tablet, TAKE 1 TABLET (500 MG TOTAL) BY MOUTH 2 (TWO) TIMES DAILY WITH A MEAL., Disp: 180 tablet, Rfl: 3   doxycycline (VIBRA-TABS) 100 MG tablet, Take 1 tablet (100 mg total) by mouth 2 (two) times daily for 10 days. 1 po bid, Disp: 20 tablet, Rfl: 0  No Known Allergies  Objective:   BP 138/88    Pulse 63    Temp (!) 97.1 F (36.2 C) (Temporal)    Ht _0  (1.651 m)    Wt 263 lb (119.3 kg)    BMI 43.77 kg/m    Physical Exam Vitals signs reviewed.  Constitutional:      General: She is not in acute distress.    Appearance: Normal appearance. She is morbidly obese. She is not ill-appearing, toxic-appearing or diaphoretic.  HENT:     Head: Normocephalic and atraumatic.  Eyes:     General: No scleral icterus.       Right eye: No discharge.        Left eye: No discharge.     Conjunctiva/sclera: Conjunctivae normal.  Neck:     Musculoskeletal: Normal range of motion.  Cardiovascular:     Rate and Rhythm: Normal rate.  Pulmonary:     Effort: Pulmonary  effort is normal. No respiratory distress.  Musculoskeletal: Normal range of motion.  Skin:    General: Skin is warm and dry.     Capillary Refill: Capillary refill takes less than 2 seconds.     Findings: Abscess (1 cm round firm abscess in the pubic region. It has not come to a head. It is not in the exact same spot as her previous abscess as I can see the other scar and was present for her previous appointments in July. ) present.  Neurological:     General: No focal deficit present.     Mental Status: She is alert and oriented to person, place, and time. Mental status is at baseline.  Psychiatric:        Mood and Affect: Mood normal.        Behavior: Behavior normal.        Thought Content: Thought content normal.        Judgment: Judgment normal.

## 2019-05-27 NOTE — Patient Instructions (Signed)
Skin Abscess  A skin abscess is an infected area on or under your skin that contains a collection of pus and other material. An abscess may also be called a furuncle, carbuncle, or boil. An abscess can occur in or on almost any part of your body. Some abscesses break open (rupture) on their own. Most continue to get worse unless they are treated. The infection can spread deeper into the body and eventually into your blood, which can make you feel ill. Treatment usually involves draining the abscess. What are the causes? An abscess occurs when germs, like bacteria, pass through your skin and cause an infection. This may be caused by:  A scrape or cut on your skin.  A puncture wound through your skin, including a needle injection or insect bite.  Blocked oil or sweat glands.  Blocked and infected hair follicles.  A cyst that forms beneath your skin (sebaceous cyst) and becomes infected. What increases the risk? This condition is more likely to develop in people who:  Have a weak body defense system (immune system).  Have diabetes.  Have dry and irritated skin.  Get frequent injections or use illegal IV drugs.  Have a foreign body in a wound, such as a splinter.  Have problems with their lymph system or veins. What are the signs or symptoms? Symptoms of this condition include:  A painful, firm bump under the skin.  A bump with pus at the top. This may break through the skin and drain. Other symptoms include:  Redness surrounding the abscess site.  Warmth.  Swelling of the lymph nodes (glands) near the abscess.  Tenderness.  A sore on the skin. How is this diagnosed? This condition may be diagnosed based on:  A physical exam.  Your medical history.  A sample of pus. This may be used to find out what is causing the infection.  Blood tests.  Imaging tests, such as an ultrasound, CT scan, or MRI. How is this treated? A small abscess that drains on its own may not  need treatment. Treatment for larger abscesses may include:  Moist heat or heat pack applied to the area several times a day.  A procedure to drain the abscess (incision and drainage).  Antibiotic medicines. For a severe abscess, you may first get antibiotics through an IV and then change to antibiotics by mouth. Follow these instructions at home: Medicines   Take over-the-counter and prescription medicines only as told by your health care provider.  If you were prescribed an antibiotic medicine, take it as told by your health care provider. Do not stop taking the antibiotic even if you start to feel better. Abscess care   If you have an abscess that has not drained, apply heat to the affected area. Use the heat source that your health care provider recommends, such as a moist heat pack or a heating pad. ? Place a towel between your skin and the heat source. ? Leave the heat on for 20-30 minutes. ? Remove the heat if your skin turns bright red. This is especially important if you are unable to feel pain, heat, or cold. You may have a greater risk of getting burned.  Follow instructions from your health care provider about how to take care of your abscess. Make sure you: ? Cover the abscess with a bandage (dressing). ? Change your dressing or gauze as told by your health care provider. ? Wash your hands with soap and water before you change the   dressing or gauze. If soap and water are not available, use hand sanitizer.  Check your abscess every day for signs of a worsening infection. Check for: ? More redness, swelling, or pain. ? More fluid or blood. ? Warmth. ? More pus or a bad smell. General instructions  To avoid spreading the infection: ? Do not share personal care items, towels, or hot tubs with others. ? Avoid making skin contact with other people.  Keep all follow-up visits as told by your health care provider. This is important. Contact a health care provider if you  have:  More redness, swelling, or pain around your abscess.  More fluid or blood coming from your abscess.  Warm skin around your abscess.  More pus or a bad smell coming from your abscess.  A fever.  Muscle aches.  Chills or a general ill feeling. Get help right away if you:  Have severe pain.  See red streaks on your skin spreading away from the abscess. Summary  A skin abscess is an infected area on or under your skin that contains a collection of pus and other material.  A small abscess that drains on its own may not need treatment.  Treatment for larger abscesses may include having a procedure to drain the abscess and taking an antibiotic. This information is not intended to replace advice given to you by your health care provider. Make sure you discuss any questions you have with your health care provider. Document Released: 06/19/2005 Document Revised: 12/31/2018 Document Reviewed: 10/23/2017 Elsevier Patient Education  2020 [employer]  

## 2019-05-28 LAB — CMP14+EGFR
ALT: 31 IU/L (ref 0–32)
AST: 17 IU/L (ref 0–40)
Albumin/Globulin Ratio: 1.8 (ref 1.2–2.2)
Albumin: 4.1 g/dL (ref 3.8–4.9)
Alkaline Phosphatase: 104 IU/L (ref 39–117)
BUN/Creatinine Ratio: 16 (ref 9–23)
BUN: 9 mg/dL (ref 6–24)
Bilirubin Total: 0.6 mg/dL (ref 0.0–1.2)
CO2: 25 mmol/L (ref 20–29)
Calcium: 9.3 mg/dL (ref 8.7–10.2)
Chloride: 102 mmol/L (ref 96–106)
Creatinine, Ser: 0.56 mg/dL — ABNORMAL LOW (ref 0.57–1.00)
GFR calc Af Amer: 118 mL/min/{1.73_m2} (ref >59)
GFR calc non Af Amer: 102 mL/min/{1.73_m2} (ref >59)
Globulin, Total: 2.3 g/dL (ref 1.5–4.5)
Glucose: 187 mg/dL — ABNORMAL HIGH (ref 65–99)
Potassium: 4.3 mmol/L (ref 3.5–5.2)
Sodium: 140 mmol/L (ref 134–144)
Total Protein: 6.4 g/dL (ref 6.0–8.5)

## 2019-05-28 LAB — LIPID PANEL
Chol/HDL Ratio: 3.5 ratio (ref 0.0–4.4)
Cholesterol, Total: 109 mg/dL (ref 100–199)
HDL: 31 mg/dL — ABNORMAL LOW (ref >39)
LDL Chol Calc (NIH): 46 mg/dL (ref 0–99)
Triglycerides: 196 mg/dL — ABNORMAL HIGH (ref 0–149)
VLDL Cholesterol Cal: 32 mg/dL (ref 5–40)

## 2019-06-02 ENCOUNTER — Encounter: Payer: Self-pay | Admitting: *Deleted

## 2019-07-19 ENCOUNTER — Ambulatory Visit: Payer: BC Managed Care – PPO | Admitting: Family Medicine

## 2019-07-19 ENCOUNTER — Other Ambulatory Visit: Payer: Self-pay

## 2019-07-19 VITALS — BP 131/79 | HR 86 | Temp 97.5°F | Ht 60.0 in | Wt 261.4 lb

## 2019-07-19 DIAGNOSIS — I10 Essential (primary) hypertension: Secondary | ICD-10-CM | POA: Diagnosis not present

## 2019-07-19 DIAGNOSIS — I152 Hypertension secondary to endocrine disorders: Secondary | ICD-10-CM

## 2019-07-19 DIAGNOSIS — E1142 Type 2 diabetes mellitus with diabetic polyneuropathy: Secondary | ICD-10-CM | POA: Diagnosis not present

## 2019-07-19 DIAGNOSIS — E785 Hyperlipidemia, unspecified: Secondary | ICD-10-CM

## 2019-07-19 DIAGNOSIS — E1169 Type 2 diabetes mellitus with other specified complication: Secondary | ICD-10-CM

## 2019-07-19 DIAGNOSIS — E1159 Type 2 diabetes mellitus with other circulatory complications: Secondary | ICD-10-CM | POA: Diagnosis not present

## 2019-07-19 NOTE — Patient Instructions (Signed)
Sugar was a little high last check.  Watch your diet.  The infections may explain the rise in sugar too.  We'll keep everything the same for now.  Recheck in January.

## 2019-07-19 NOTE — Progress Notes (Signed)
Subjective: CC: DM2 PCP: [name], DO EGB:TDVVO Carla Mason is a 58 y.o. female presenting to clinic today for:  1. Type 2 Diabetes w/ HTN/ HLD She reports recent skin infections that may be due to use of cottonelle wet wipe.  Taking medication(s): Metformin, Azor, Lipitor.  Side effects: None.    Last eye exam: UTD;  No retinopathy Last foot exam:  needs Last A1c:  Lab Results  Component Value Date   HGBA1C 7.3 (H) 05/27/2019   Nephropathy screen indicated?: on ARB Last flu, zoster and/or pneumovax: PNA vaccine declined  ROS: Denies CP, SOB, edema, falls.  She has chronic numbness and tingling of LE.   No Known Allergies Past Medical History:  Diagnosis Date  . Diabetes mellitus without complication ([place])   . Hyperlipidemia   . Hypertension   . Neuropathy   . Obesity   . Vitamin D deficiency disease     Current Outpatient Medications:  .  amLODipine-olmesartan (AZOR) 5-40 MG tablet, Take 1 tablet by mouth daily., Disp: 90 tablet, Rfl: 3 .  aspirin 81 MG chewable tablet, Chew 81 mg by mouth daily., Disp: , Rfl:  .  atorvastatin (LIPITOR) 40 MG tablet, TAKE 1 TABLET (40 MG TOTAL) BY MOUTH DAILY., Disp: 90 tablet, Rfl: 3 .  betamethasone, augmented, (DIPROLENE) 0.05 % lotion, APPLY TOPICALLY 2 (TWO) TIMES DAILY., Disp: 60 mL, Rfl: 2 .  blood glucose meter kit and supplies, Dispense based on patient and insurance preference. Use up to one time daily as directed. (FOR ICD-10 E10.9, E11.9)., Disp: 1 each, Rfl: 0 .  cholecalciferol (VITAMIN D) 1000 UNITS tablet, Take 2,000 Units by mouth daily., Disp: , Rfl:  .  furosemide (LASIX) 20 MG tablet, Take 1 tablet (20 mg total) by mouth daily., Disp: 90 tablet, Rfl: 1 .  glucose blood test strip, UAD to test BG once daily, Disp: 100 each, Rfl: 3 .  Lancet Device MISC, Use to test BG once daily, Disp: 100 each, Rfl: 3 .  metFORMIN (GLUCOPHAGE) 500 MG tablet, TAKE 1 TABLET (500 MG TOTAL) BY MOUTH 2 (TWO) TIMES DAILY WITH A  MEAL., Disp: 180 tablet, Rfl: 3 Social History   Socioeconomic History  . Marital status: Married    Spouse name: Not on file  . Number of children: Not on file  . Years of education: Not on file  . Highest education level: Not on file  Occupational History  . Not on file  Social Needs  . Financial resource strain: Not on file  . Food insecurity    Worry: Not on file    Inability: Not on file  . Transportation needs    Medical: Not on file    Non-medical: Not on file  Tobacco Use  . Smoking status: Never Smoker  . Smokeless tobacco: Never Used  Substance and Sexual Activity  . Alcohol use: Never    Frequency: Never  . Drug use: Never  . Sexual activity: Not on file  Lifestyle  . Physical activity    Days per week: Not on file    Minutes per session: Not on file  . Stress: Not on file  Relationships  . Social Herbalist on phone: Not on file    Gets together: Not on file    Attends religious service: Not on file    Active member of club or organization: Not on file    Attends meetings of clubs or organizations: Not on file  Relationship status: Not on file  . Intimate partner violence    Fear of current or ex partner: Not on file    Emotionally abused: Not on file    Physically abused: Not on file    Forced sexual activity: Not on file  Other Topics Concern  . Not on file  Social History Narrative  . Not on file   Family History  Problem Relation Age of Onset  . Heart attack Mother   . Cancer Father   . CVA Maternal Grandmother   . Heart attack Maternal Grandfather   . Cancer Sister   . Early death Brother   . Heart attack Sister   . Healthy Sister     Objective: Office vital signs reviewed. BP 131/79   Pulse 86   Temp (!) 97.5 F (36.4 C) (Temporal)   Ht 5' (1.524 m)   Wt 261 lb 6.4 oz (118.6 kg)   SpO2 94%   BMI 51.05 kg/m   Physical Examination:  General: Awake, alert, obese, No acute distress HEENT: sclera white, MMM Cardio:  regular rate and rhythm, S1S2 heard, 2/6 SEM appreciated at bilateral SB Pulm: clear to auscultation bilaterally, no wheezes, rhonchi or rales; normal work of breathing on room air Extremities: warm, well perfused, No edema, cyanosis or clubbing; +2 pulses bilaterally MSK: stiff gait and station Neuro: see DM foot Diabetic Foot Exam - Simple   Simple Foot Form Diabetic Foot exam was performed with the following findings: Yes 07/19/2019  3:29 PM  Visual Inspection No deformities, no ulcerations, no other skin breakdown bilaterally: Yes Sensation Testing Intact to touch and monofilament testing bilaterally: Yes Pulse Check Posterior Tibialis and Dorsalis pulse intact bilaterally: Yes Comments Good vibratory sensation. Mild skin peeling on left heel.    Assessment/ Plan: 58 y.o. female   1. Type 2 diabetes mellitus with diabetic polyneuropathy, unspecified whether long term insulin use ([place]) Not controlled. May be due to recent infections.  Continue metformin BID for now.  Recheck in 2 months.  DM foot exam performed today.  2. Hypertension associated with diabetes ([place]) Controlled.  3. Hyperlipidemia associated with type 2 diabetes mellitus ([place]) Continue statin.   No orders of the defined types were placed in this encounter.  No orders of the defined types were placed in this encounter.    [name], DO [place] [phone]

## 2019-09-06 IMAGING — DX DG KNEE 1-2V*R*
2 series · 2 of 2 positions shown · non-contrast
Comparison: 02/21/2017 radiographs

CLINICAL DATA: 56-year-old female with chronic RIGHT knee pain.

EXAM:
RIGHT KNEE - 1-2 VIEW

[knee ap]
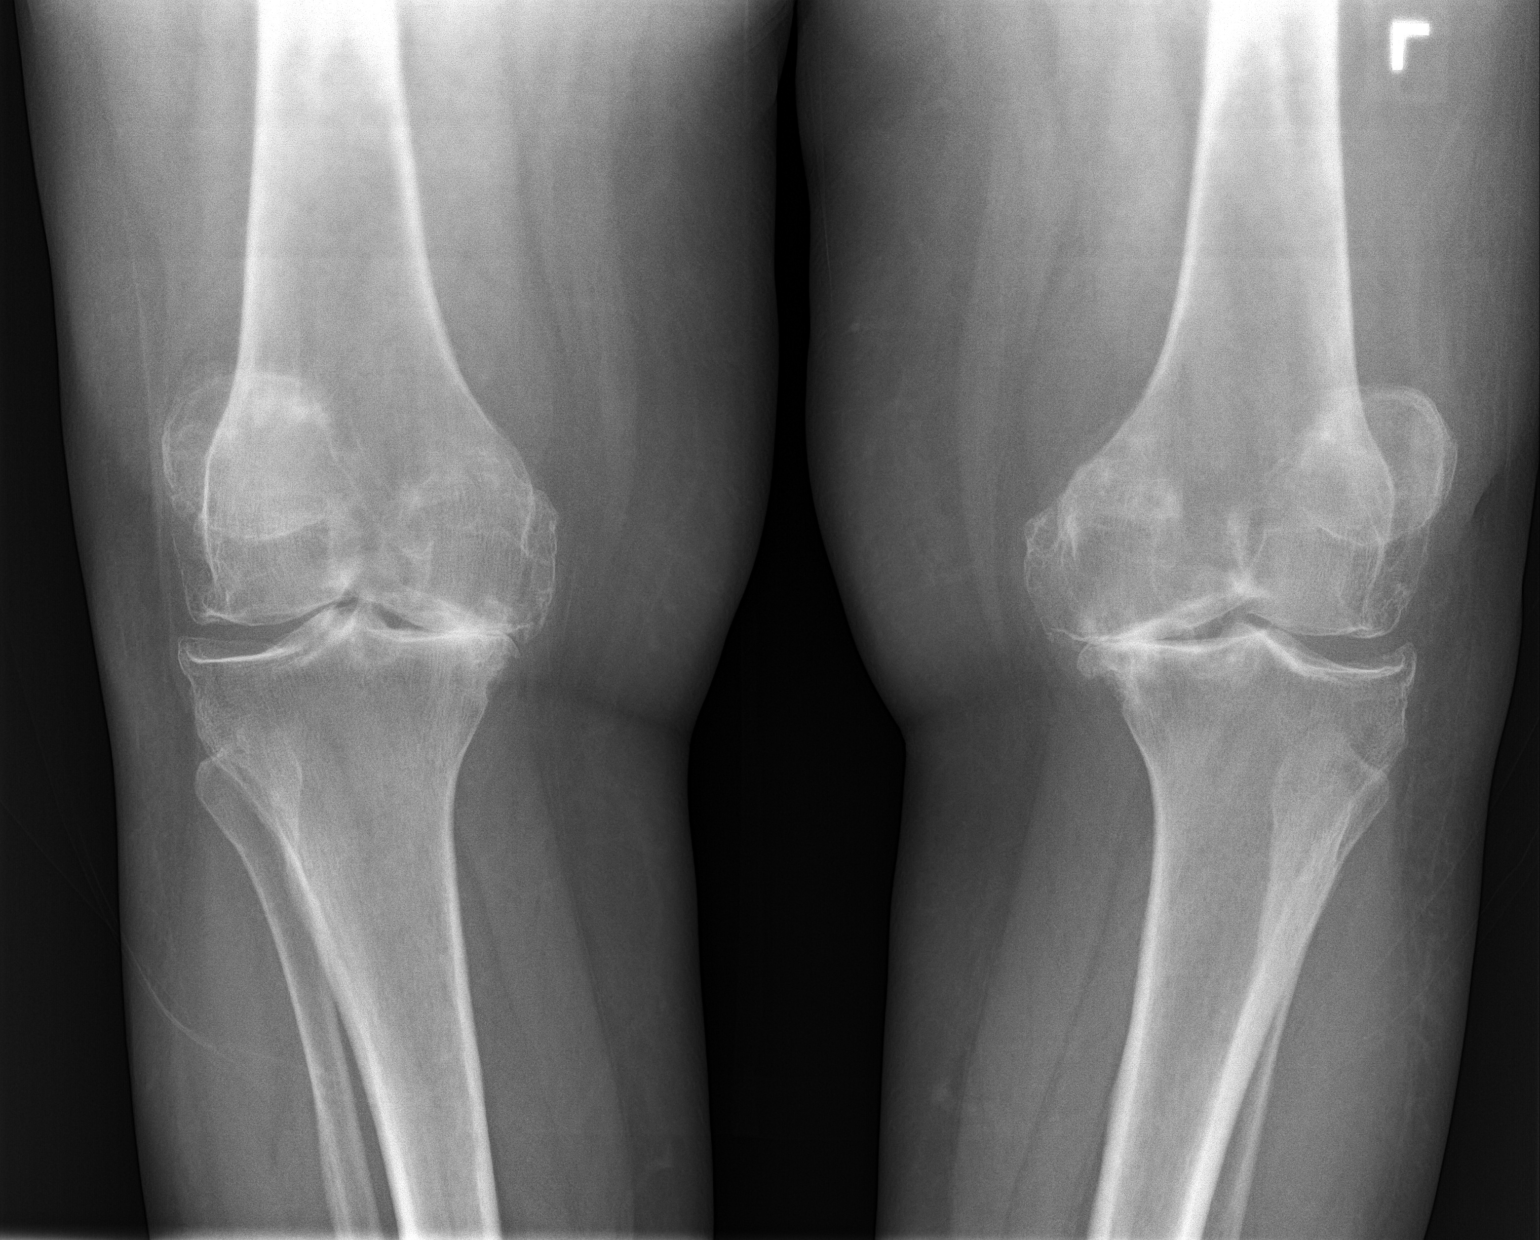

[knee lat]
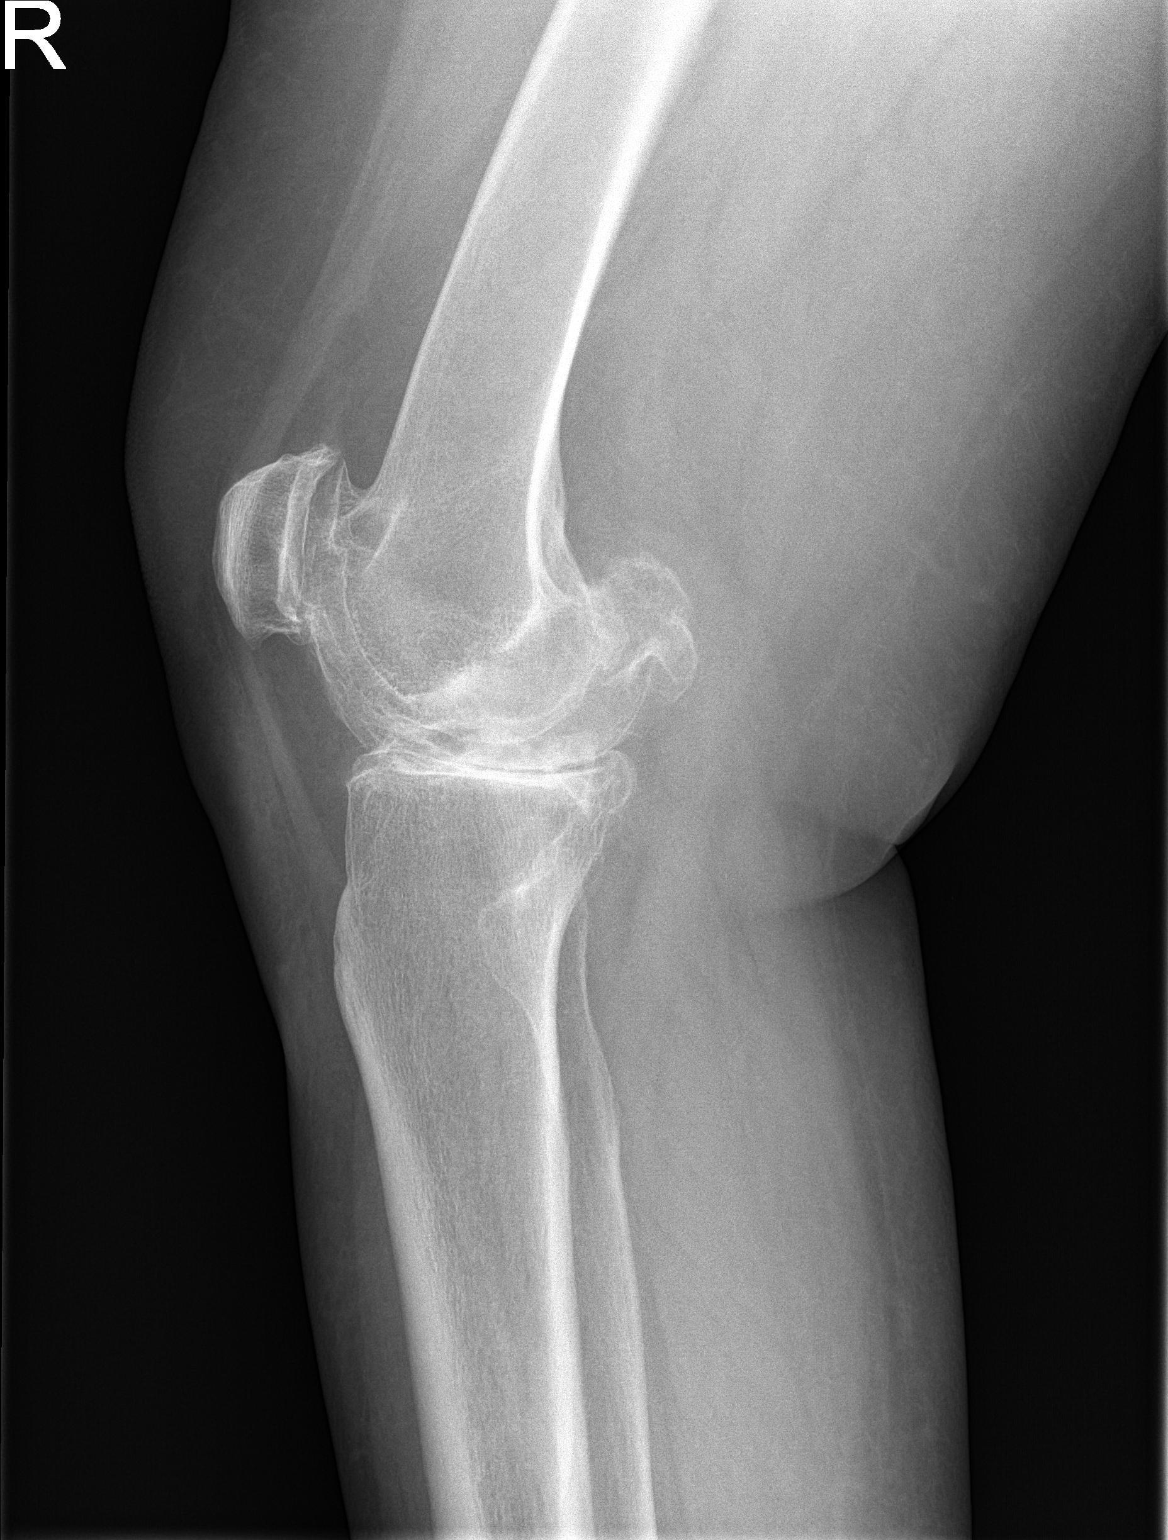

[2 of 2 positions shown; findings below may reference images not displayed]

FINDINGS: Severe tricompartmental degenerative changes bilaterally are noted
with joint space loss and osteophytosis, greatest in the MEDIAL and
patellofemoral compartments.

No acute fracture or dislocation noted.

No suspicious bony lesions.
IMPRESSION: Severe tricompartmental degenerative changes within both knees.

## 2019-09-20 ENCOUNTER — Other Ambulatory Visit: Payer: Self-pay

## 2019-09-20 ENCOUNTER — Other Ambulatory Visit: Payer: BC Managed Care – PPO

## 2019-09-20 ENCOUNTER — Other Ambulatory Visit: Payer: Self-pay | Admitting: Family Medicine

## 2019-09-20 DIAGNOSIS — E1169 Type 2 diabetes mellitus with other specified complication: Secondary | ICD-10-CM

## 2019-09-20 DIAGNOSIS — E1142 Type 2 diabetes mellitus with diabetic polyneuropathy: Secondary | ICD-10-CM

## 2019-09-20 DIAGNOSIS — I152 Hypertension secondary to endocrine disorders: Secondary | ICD-10-CM

## 2019-09-20 DIAGNOSIS — E785 Hyperlipidemia, unspecified: Secondary | ICD-10-CM

## 2019-09-20 DIAGNOSIS — E1159 Type 2 diabetes mellitus with other circulatory complications: Secondary | ICD-10-CM | POA: Diagnosis not present

## 2019-09-20 DIAGNOSIS — I10 Essential (primary) hypertension: Secondary | ICD-10-CM | POA: Diagnosis not present

## 2019-09-20 LAB — BAYER DCA HB A1C WAIVED: HB A1C (BAYER DCA - WAIVED): 7.5 % — ABNORMAL HIGH (ref <7.0)

## 2019-09-21 LAB — CMP14+EGFR
ALT: 26 IU/L (ref 0–32)
AST: 17 IU/L (ref 0–40)
Albumin/Globulin Ratio: 2 (ref 1.2–2.2)
Albumin: 4 g/dL (ref 3.8–4.9)
Alkaline Phosphatase: 107 IU/L (ref 39–117)
BUN/Creatinine Ratio: 15 (ref 12–28)
BUN: 9 mg/dL (ref 8–27)
Bilirubin Total: 0.7 mg/dL (ref 0.0–1.2)
CO2: 24 mmol/L (ref 20–29)
Calcium: 9.3 mg/dL (ref 8.7–10.3)
Chloride: 101 mmol/L (ref 96–106)
Creatinine, Ser: 0.59 mg/dL (ref 0.57–1.00)
GFR calc Af Amer: 115 mL/min/{1.73_m2} (ref >59)
GFR calc non Af Amer: 100 mL/min/{1.73_m2} (ref >59)
Globulin, Total: 2 g/dL (ref 1.5–4.5)
Glucose: 171 mg/dL — ABNORMAL HIGH (ref 65–99)
Potassium: 4.3 mmol/L (ref 3.5–5.2)
Sodium: 138 mmol/L (ref 134–144)
Total Protein: 6 g/dL (ref 6.0–8.5)

## 2019-09-21 LAB — LIPID PANEL
Chol/HDL Ratio: 4.3 ratio (ref 0.0–4.4)
Cholesterol, Total: 119 mg/dL (ref 100–199)
HDL: 28 mg/dL — ABNORMAL LOW (ref >39)
LDL Chol Calc (NIH): 55 mg/dL (ref 0–99)
Triglycerides: 222 mg/dL — ABNORMAL HIGH (ref 0–149)
VLDL Cholesterol Cal: 36 mg/dL (ref 5–40)

## 2019-10-04 ENCOUNTER — Ambulatory Visit: Payer: BC Managed Care – PPO | Admitting: Family Medicine

## 2019-10-05 ENCOUNTER — Encounter: Payer: Self-pay | Admitting: Family Medicine

## 2019-11-02 ENCOUNTER — Other Ambulatory Visit: Payer: Self-pay

## 2019-11-02 ENCOUNTER — Telehealth: Payer: Self-pay | Admitting: Family Medicine

## 2019-11-03 ENCOUNTER — Ambulatory Visit: Payer: BC Managed Care – PPO | Admitting: Family Medicine

## 2019-11-03 ENCOUNTER — Encounter: Payer: Self-pay | Admitting: Family Medicine

## 2019-11-03 VITALS — BP 132/82 | HR 78 | Temp 97.5°F | Ht 60.0 in | Wt 260.0 lb

## 2019-11-03 DIAGNOSIS — I152 Hypertension secondary to endocrine disorders: Secondary | ICD-10-CM

## 2019-11-03 DIAGNOSIS — E1169 Type 2 diabetes mellitus with other specified complication: Secondary | ICD-10-CM | POA: Diagnosis not present

## 2019-11-03 DIAGNOSIS — E1165 Type 2 diabetes mellitus with hyperglycemia: Secondary | ICD-10-CM

## 2019-11-03 DIAGNOSIS — I10 Essential (primary) hypertension: Secondary | ICD-10-CM | POA: Diagnosis not present

## 2019-11-03 DIAGNOSIS — E785 Hyperlipidemia, unspecified: Secondary | ICD-10-CM

## 2019-11-03 DIAGNOSIS — E1159 Type 2 diabetes mellitus with other circulatory complications: Secondary | ICD-10-CM

## 2019-11-03 NOTE — Patient Instructions (Addendum)
Cardiologist: Baldo Daub, MD  [address]  [place], [place] [address]  Phone: [phone]     Carbohydrate Counting for Diabetes Mellitus, Adult  Carbohydrate counting is a method of keeping track of how many carbohydrates you eat. Eating carbohydrates naturally increases the amount of sugar (glucose) in the blood. Counting how many carbohydrates you eat helps keep your blood glucose within normal limits, which helps you manage your diabetes (diabetes mellitus). It is important to know how many carbohydrates you can safely have in each meal. This is different for every person. A diet and nutrition specialist (registered dietitian) can help you make a meal plan and calculate how many carbohydrates you should have at each meal and snack. Carbohydrates are found in the following foods:  Grains, such as breads and cereals.  Dried beans and soy products.  Starchy vegetables, such as potatoes, peas, and corn.  Fruit and fruit juices.  Milk and yogurt.  Sweets and snack foods, such as cake, cookies, candy, chips, and soft drinks. How do I count carbohydrates? There are two ways to count carbohydrates in food. You can use either of the methods or a combination of both. Reading "Nutrition Facts" on packaged food The "Nutrition Facts" list is included on the labels of almost all packaged foods and beverages in the [place] It includes:  The serving size.  Information about nutrients in each serving, including the grams (g) of carbohydrate per serving. To use the "Nutrition Facts":  Decide how many servings you will have.  Multiply the number of servings by the number of carbohydrates per serving.  The resulting number is the total amount of carbohydrates that you will be having. Learning standard serving sizes of other foods When you eat carbohydrate foods that are not packaged or do not include "Nutrition Facts" on the label, you need to measure the servings in order to  count the amount of carbohydrates:  Measure the foods that you will eat with a food scale or measuring cup, if needed.  Decide how many standard-size servings you will eat.  Multiply the number of servings by 15. Most carbohydrate-rich foods have about 15 g of carbohydrates per serving. ? For example, if you eat 8 oz (170 g) of strawberries, you will have eaten 2 servings and 30 g of carbohydrates (2 servings x 15 g = 30 g).  For foods that have more than one food mixed, such as soups and casseroles, you must count the carbohydrates in each food that is included. The following list contains standard serving sizes of common carbohydrate-rich foods. Each of these servings has about 15 g of carbohydrates:   hamburger bun or  English muffin.   oz (15 mL) syrup.   oz (14 g) jelly.  1 slice of bread.  1 six-inch tortilla.  3 oz (85 g) cooked rice or pasta.  4 oz (113 g) cooked dried beans.  4 oz (113 g) starchy vegetable, such as peas, corn, or potatoes.  4 oz (113 g) hot cereal.  4 oz (113 g) mashed potatoes or  of a large baked potato.  4 oz (113 g) canned or frozen fruit.  4 oz (120 mL) fruit juice.  4-6 crackers.  6 chicken nuggets.  6 oz (170 g) unsweetened dry cereal.  6 oz (170 g) plain fat-free yogurt or yogurt sweetened with artificial sweeteners.  8 oz (240 mL) milk.  8 oz (170 g) fresh fruit or one small piece of fruit.  24 oz (680  g) popped popcorn. Example of carbohydrate counting Sample meal  3 oz (85 g) chicken breast.  6 oz (170 g) brown rice.  4 oz (113 g) corn.  8 oz (240 mL) milk.  8 oz (170 g) strawberries with sugar-free whipped topping. Carbohydrate calculation 1. Identify the foods that contain carbohydrates: ? Rice. ? Corn. ? Milk. ? Strawberries. 2. Calculate how many servings you have of each food: ? 2 servings rice. ? 1 serving corn. ? 1 serving milk. ? 1 serving strawberries. 3. Multiply each number of servings by 15  g: ? 2 servings rice x 15 g = 30 g. ? 1 serving corn x 15 g = 15 g. ? 1 serving milk x 15 g = 15 g. ? 1 serving strawberries x 15 g = 15 g. 4. Add together all of the amounts to find the total grams of carbohydrates eaten: ? 30 g + 15 g + 15 g + 15 g = 75 g of carbohydrates total. Summary  Carbohydrate counting is a method of keeping track of how many carbohydrates you eat.  Eating carbohydrates naturally increases the amount of sugar (glucose) in the blood.  Counting how many carbohydrates you eat helps keep your blood glucose within normal limits, which helps you manage your diabetes.  A diet and nutrition specialist (registered dietitian) can help you make a meal plan and calculate how many carbohydrates you should have at each meal and snack. This information is not intended to replace advice given to you by your health care provider. Make sure you discuss any questions you have with your health care provider. Document Revised: 04/03/2017 Document Reviewed: 02/21/2016 Elsevier Patient Education  [place].

## 2019-11-03 NOTE — Progress Notes (Signed)
Subjective: CC: DM2 PCP: Carla Norlander, DO OZH:YQMVH Carla Mason is a 59 y.o. female presenting to clinic today for:  1. Type 2 Diabetes w/ HTN/ HLD She reports skin infections have not recurred.  Taking medication(s): Metformin, Azor, Lipitor.  Side effects: None.  She denies any visual disturbance, chest pain, shortness of breath, dizziness.  She does admit to not following a very strict diet.  She has chronic neuropathy of the lower extremities.  Last eye exam: UTD;  No retinopathy Last foot exam:  UTD Last A1c:  Lab Results  Component Value Date   HGBA1C 7.5 (H) 09/20/2019   Nephropathy screen indicated?: on ARB Last flu, zoster and/or pneumovax: PNA vaccine declined   No Known Allergies Past Medical History:  Diagnosis Date  . Diabetes mellitus without complication ([place])   . Hyperlipidemia   . Hypertension   . Neuropathy   . Obesity   . Vitamin D deficiency disease     Current Outpatient Medications:  .  amLODipine-olmesartan (AZOR) 5-40 MG tablet, Take 1 tablet by mouth daily., Disp: 90 tablet, Rfl: 3 .  aspirin 81 MG chewable tablet, Chew 81 mg by mouth daily., Disp: , Rfl:  .  atorvastatin (LIPITOR) 40 MG tablet, TAKE 1 TABLET (40 MG TOTAL) BY MOUTH DAILY., Disp: 90 tablet, Rfl: 3 .  betamethasone, augmented, (DIPROLENE) 0.05 % lotion, APPLY TOPICALLY 2 (TWO) TIMES DAILY., Disp: 60 mL, Rfl: 2 .  blood glucose meter kit and supplies, Dispense based on patient and insurance preference. Use up to one time daily as directed. (FOR ICD-10 E10.9, E11.9)., Disp: 1 each, Rfl: 0 .  cholecalciferol (VITAMIN D) 1000 UNITS tablet, Take 2,000 Units by mouth daily., Disp: , Rfl:  .  furosemide (LASIX) 20 MG tablet, Take 1 tablet (20 mg total) by mouth daily., Disp: 90 tablet, Rfl: 1 .  glucose blood test strip, UAD to test BG once daily, Disp: 100 each, Rfl: 3 .  Lancet Device MISC, Use to test BG once daily, Disp: 100 each, Rfl: 3 .  metFORMIN (GLUCOPHAGE) 500 MG tablet, TAKE  1 TABLET (500 MG TOTAL) BY MOUTH 2 (TWO) TIMES DAILY WITH A MEAL., Disp: 180 tablet, Rfl: 3 Social History   Socioeconomic History  . Marital status: Married    Spouse name: Not on file  . Number of children: Not on file  . Years of education: Not on file  . Highest education level: Not on file  Occupational History  . Not on file  Tobacco Use  . Smoking status: Never Smoker  . Smokeless tobacco: Never Used  Substance and Sexual Activity  . Alcohol use: Never  . Drug use: Never  . Sexual activity: Not on file  Other Topics Concern  . Not on file  Social History Narrative  . Not on file   Social Determinants of Health   Financial Resource Strain:   . Difficulty of Paying Living Expenses: Not on file  Food Insecurity:   . Worried About Charity fundraiser in the Last Year: Not on file  . Ran Out of Food in the Last Year: Not on file  Transportation Needs:   . Lack of Transportation (Medical): Not on file  . Lack of Transportation (Non-Medical): Not on file  Physical Activity:   . Days of Exercise per Week: Not on file  . Minutes of Exercise per Session: Not on file  Stress:   . Feeling of Stress : Not on file  Social Connections:   .  Frequency of Communication with Friends and Family: Not on file  . Frequency of Social Gatherings with Friends and Family: Not on file  . Attends Religious Services: Not on file  . Active Member of Clubs or Organizations: Not on file  . Attends Archivist Meetings: Not on file  . Marital Status: Not on file  Intimate Partner Violence:   . Fear of Current or Ex-Partner: Not on file  . Emotionally Abused: Not on file  . Physically Abused: Not on file  . Sexually Abused: Not on file   Family History  Problem Relation Age of Onset  . Heart attack Mother   . Cancer Father   . CVA Maternal Grandmother   . Heart attack Maternal Grandfather   . Cancer Sister   . Early death Brother   . Heart attack Sister   . Healthy Sister      Objective: Office vital signs reviewed. BP 132/82   Pulse 78   Temp (!) 97.5 F (36.4 C) (Temporal)   Ht 5' (1.524 m)   Wt 260 lb (117.9 kg)   SpO2 94%   BMI 50.78 kg/m   Physical Examination:  General: Awake, alert, obese, No acute distress HEENT: sclera white, MMM Cardio: regular rate and rhythm, S1S2 heard, 2/6 SEM appreciated at bilateral sternal borders Pulm: clear to auscultation bilaterally, no wheezes, rhonchi or rales; normal work of breathing on room air Extremities: warm, well perfused, No edema, cyanosis or clubbing; +2 pulses bilaterally MSK: stiff gait and station  Assessment/ Plan: 59 y.o. female   1. Uncontrolled type 2 diabetes mellitus with hyperglycemia (HCC) Blood sugar remains uncontrolled with A1c of 7.5.  This was checked in December.  She is not yet due for repeat.  She is not checking her blood pressures regularly at home and I have encouraged her to do this.  She is welcome to bring in her monitor for review here in the office.  I had plan to increase her Metformin dose to 1000 mg twice daily but she did not really want to do this today and instead wanted to try and really cut back.  We discussed carb modification, increased physical exercise.  2. Hypertension associated with diabetes ([place]) Controlled.  Continue current regimen  3. Hyperlipidemia associated with type 2 diabetes mellitus (HCC) Continue statin.  She will follow-up in May for repeat A1c.  If remains uncontrolled at that time, plan to increase Metformin to 1000 mg twice daily  No orders of the defined types were placed in this encounter.  No orders of the defined types were placed in this encounter.    Carla Norlander, DO [place] [phone]

## 2019-11-07 ENCOUNTER — Other Ambulatory Visit: Payer: Self-pay | Admitting: Family Medicine

## 2019-11-07 DIAGNOSIS — E1159 Type 2 diabetes mellitus with other circulatory complications: Secondary | ICD-10-CM

## 2019-11-07 DIAGNOSIS — I152 Hypertension secondary to endocrine disorders: Secondary | ICD-10-CM

## 2019-11-07 DIAGNOSIS — E785 Hyperlipidemia, unspecified: Secondary | ICD-10-CM

## 2019-12-18 ENCOUNTER — Other Ambulatory Visit: Payer: Self-pay | Admitting: Family Medicine

## 2019-12-18 DIAGNOSIS — R609 Edema, unspecified: Secondary | ICD-10-CM

## 2019-12-20 ENCOUNTER — Other Ambulatory Visit: Payer: Self-pay | Admitting: Family Medicine

## 2019-12-20 DIAGNOSIS — E1142 Type 2 diabetes mellitus with diabetic polyneuropathy: Secondary | ICD-10-CM

## 2020-01-12 ENCOUNTER — Other Ambulatory Visit: Payer: Self-pay | Admitting: Family Medicine

## 2020-01-12 DIAGNOSIS — R609 Edema, unspecified: Secondary | ICD-10-CM

## 2020-02-06 ENCOUNTER — Other Ambulatory Visit: Payer: Self-pay | Admitting: Family Medicine

## 2020-02-06 DIAGNOSIS — R609 Edema, unspecified: Secondary | ICD-10-CM

## 2020-02-18 ENCOUNTER — Ambulatory Visit: Payer: BC Managed Care – PPO | Admitting: Family Medicine

## 2020-02-18 ENCOUNTER — Other Ambulatory Visit: Payer: Self-pay

## 2020-02-18 ENCOUNTER — Encounter: Payer: Self-pay | Admitting: Family Medicine

## 2020-02-18 VITALS — BP 139/74 | HR 71 | Temp 98.1°F | Ht 60.0 in | Wt 256.8 lb

## 2020-02-18 DIAGNOSIS — B356 Tinea cruris: Secondary | ICD-10-CM | POA: Diagnosis not present

## 2020-02-18 DIAGNOSIS — E1159 Type 2 diabetes mellitus with other circulatory complications: Secondary | ICD-10-CM | POA: Diagnosis not present

## 2020-02-18 DIAGNOSIS — I152 Hypertension secondary to endocrine disorders: Secondary | ICD-10-CM

## 2020-02-18 DIAGNOSIS — E1165 Type 2 diabetes mellitus with hyperglycemia: Secondary | ICD-10-CM

## 2020-02-18 DIAGNOSIS — E785 Hyperlipidemia, unspecified: Secondary | ICD-10-CM

## 2020-02-18 DIAGNOSIS — I10 Essential (primary) hypertension: Secondary | ICD-10-CM

## 2020-02-18 DIAGNOSIS — E1169 Type 2 diabetes mellitus with other specified complication: Secondary | ICD-10-CM | POA: Diagnosis not present

## 2020-02-18 DIAGNOSIS — R609 Edema, unspecified: Secondary | ICD-10-CM

## 2020-02-18 LAB — BAYER DCA HB A1C WAIVED: HB A1C (BAYER DCA - WAIVED): 7.5 % — ABNORMAL HIGH (ref <7.0)

## 2020-02-18 MED ORDER — METFORMIN HCL 500 MG PO TABS: ORAL_TABLET | ORAL | 0 refills | Status: DC

## 2020-02-18 MED ORDER — FUROSEMIDE 20 MG PO TABS: 20.0000 mg | ORAL_TABLET | Freq: Every day | ORAL | 3 refills | Status: DC

## 2020-02-18 MED ORDER — GRISEOFULVIN MICROSIZE 500 MG PO TABS: 500.0000 mg | ORAL_TABLET | Freq: Every day | ORAL | 0 refills | Status: DC

## 2020-02-18 NOTE — Progress Notes (Signed)
Subjective: CC: DM2 PCP: Carla Norlander, DO EPP:Carla Mason is a 59 y.o. female presenting to clinic today for:  1. Type 2 Diabetes w/ HTN/ HLD Sugar was uncontrolled at last visit but patient was reluctant to advance her medications.  Instead she wanted to try modifying diet and therefore we gave her a 90-monthgrace period to see if we could achieve better blood sugar levels without medication adjustment.  Taking medication(s): Metformin, Azor, Lipitor.  She admits that she did not change her diet since last visit.  She has not been monitoring her blood sugars either.  She denies any symptoms including blurred vision, weight, dizziness, sweating, chest pain, shortness of breath, changes in sensation.  Last eye exam: Needs Last foot exam:  UTD Last A1c:  Lab Results  Component Value Date   HGBA1C 7.5 (H) 09/20/2019   Nephropathy screen indicated?: on ARB Last flu, zoster and/or pneumovax: PNA vaccine declined  2.  Rash Patient reports that she has a rash that extends from the vaginal area to her rectum.  She notes that she had a similar one several years ago that required a daily medication for 1 month before resolving.  She is been applying various creams and using various soaps but symptoms are not improving.  Rash is itchy and burning.   No Known Allergies Past Medical History:  Diagnosis Date  . [place]   . Hyperlipidemia   . Hypertension   . Neuropathy   . Obesity   . Vitamin D deficiency disease     Current Outpatient Medications:  .  amLODipine-olmesartan (AZOR) 5-40 MG tablet, TAKE 1 TABLET BY MOUTH DAILY, Disp: 90 tablet, Rfl: 1 .  aspirin 81 MG chewable tablet, Chew 81 mg by mouth daily., Disp: , Rfl:  .  atorvastatin (LIPITOR) 40 MG tablet, TAKE 1 TABLET BY MOUTH DAILY, Disp: 90 tablet, Rfl: 1 .  betamethasone, augmented, (DIPROLENE) 0.05 % lotion, APPLY TOPICALLY 2 (TWO) TIMES DAILY., Disp: 60 mL, Rfl: 2 .  blood  glucose meter kit and supplies, Dispense based on patient and insurance preference. Use up to one time daily as directed. (FOR ICD-10 E10.9, E11.9)., Disp: 1 each, Rfl: 0 .  cholecalciferol (VITAMIN D) 1000 UNITS tablet, Take 2,000 Units by mouth daily., Disp: , Rfl:  .  furosemide (LASIX) 20 MG tablet, TAKE 1 TABLET BY MOUTH EVERY DAY, Disp: 30 tablet, Rfl: 0 .  glucose blood test strip, UAD to test BG once daily, Disp: 100 each, Rfl: 3 .  Lancet Device MISC, Use to test BG once daily, Disp: 100 each, Rfl: 3 .  metFORMIN (GLUCOPHAGE) 500 MG tablet, TAKE 1 TABLET BY MOUTH 2 (TWO) TIMES DAILY WITH A MEAL, Disp: 180 tablet, Rfl: 0 Social History   Socioeconomic History  . Marital status: Married    Spouse name: Not on file  . Number of children: Not on file  . Years of education: Not on file  . Highest education level: Not on file  Occupational History  . Not on file  Tobacco Use  . Smoking status: Never Smoker  . Smokeless tobacco: Never Used  Substance and Sexual Activity  . Alcohol use: Never  . Drug use: Never  . Sexual activity: Not on file  Other Topics Concern  . Not on file  Social History Narrative  . Not on file   Social Determinants of Health   Financial Resource Strain:   . Difficulty of Paying Living Expenses:  Food Insecurity:   . Worried About Charity fundraiser in the Last Year:   . Arboriculturist in the Last Year:   Transportation Needs:   . Film/video editor (Medical):   Marland Kitchen Lack of Transportation (Non-Medical):   Physical Activity:   . Days of Exercise per Week:   . Minutes of Exercise per Session:   Stress:   . Feeling of Stress :   Social Connections:   . Frequency of Communication with Friends and Family:   . Frequency of Social Gatherings with Friends and Family:   . Attends Religious Services:   . Active Member of Clubs or Organizations:   . Attends Archivist Meetings:   Marland Kitchen Marital Status:   Intimate Partner Violence:   . Fear  of Current or Ex-Partner:   . Emotionally Abused:   Marland Kitchen Physically Abused:   . Sexually Abused:    Family History  Problem Relation Age of Onset  . Heart attack Mother   . Cancer Father   . CVA Maternal Grandmother   . Heart attack Maternal Grandfather   . Cancer Sister   . Early death Brother   . Heart attack Sister   . Healthy Sister     Objective: Office vital signs reviewed. BP 139/74   Pulse 71   Temp 98.1 F (36.7 C)   Ht 5' (1.524 m)   Wt 256 lb 12.8 oz (116.5 kg)   SpO2 93%   BMI 50.15 kg/m   Physical Examination:  General: Awake, alert, obese, No acute distress HEENT: sclera white, MMM Cardio: regular rate and rhythm, S1S2 heard, 2/6 SEM appreciated at bilateral sternal borders Pulm: clear to auscultation bilaterally, no wheezes, rhonchi or rales; normal work of breathing on room air Extremities: warm, well perfused, No edema, cyanosis or clubbing; +2 pulses bilaterally Skin: She has a mildly erythematous rash with raised borders and central clearing extending from the apex of the vagina to the apex of the gluteal cleft. MSK: stiff gait and station  Assessment/ Plan: 59 y.o. female   1. Uncontrolled type 2 diabetes mellitus with hyperglycemia (HCC) A1c persistently uncontrolled with number at 7.5 today.  I am going to increase her Metformin to 1000 mg in the morning and continue 500 mg in the afternoon.  If she is able to get her A1c below 6.5 we can resume 500 mg twice daily dosing. - Bayer DCA Hb A1c Waived - metFORMIN (GLUCOPHAGE) 500 MG tablet; TAKE 2 TABLETS QAM with breakfast and 1 tablet with supper  Dispense: 270 tablet; Refill: 0  2. Hypertension associated with diabetes ([place]) Controlled  3. Hyperlipidemia associated with type 2 diabetes mellitus ([place]) Continue statin  4. Tinea cruris Treat with griseofulvin 500 mg daily for 4 weeks.  She will schedule a follow-up visit via telephone in 4 weeks.  If persistent will extend another 2 weeks -  griseofulvin (GRIFULVIN V) 500 MG tablet; Take 1 tablet (500 mg total) by mouth daily.  Dispense: 30 tablet; Refill: 0  5. Peripheral edema Stable - furosemide (LASIX) 20 MG tablet; Take 1 tablet (20 mg total) by mouth daily.  Dispense: 90 tablet; Refill: 3  No orders of the defined types were placed in this encounter.  No orders of the defined types were placed in this encounter.    Carla Norlander, DO [place] [phone]

## 2020-03-21 ENCOUNTER — Ambulatory Visit (INDEPENDENT_AMBULATORY_CARE_PROVIDER_SITE_OTHER): Payer: BC Managed Care – PPO | Admitting: Family Medicine

## 2020-03-21 DIAGNOSIS — B356 Tinea cruris: Secondary | ICD-10-CM

## 2020-03-21 MED ORDER — GRISEOFULVIN MICROSIZE 500 MG PO TABS: 500.0000 mg | ORAL_TABLET | Freq: Every day | ORAL | 0 refills | Status: DC

## 2020-03-21 NOTE — Progress Notes (Signed)
Telephone visit  Subjective: CC: f/u Tinea PCP: [name], DO ZOX:WRUEA AARTHI UYENO is a 59 y.o. female calls for telephone consult today. Patient provides verbal consent for consult held via phone.  Due to COVID-19 pandemic this visit was conducted virtually. This visit type was conducted due to national recommendations for restrictions regarding the COVID-19 Pandemic (e.g. social distancing, sheltering in place) in an effort to limit this patient's exposure and mitigate transmission in our community. All issues noted in this document were discussed and addressed.  A physical exam was not performed with this format.   Location of patient: home Location of provider: [facility] Others present for call: none  1. Tinea cruris Patient was seen 4 weeks ago and diagnosed with tinea.  She was started on griseofulvin and advised to continue for 4 weeks with recheck.  Today she reports that it seemed like it was clearing up. She has a few tablets left.  She feels it is only about 25% better.  The front area seems to be what has really gotten better but the rear area continues to have an itchy rash.  She can still feel raised edges.   ROS: Per HPI  No Known Allergies Past Medical History:  Diagnosis Date  . Diabetes mellitus without complication ([place])   . Hyperlipidemia   . Hypertension   . Neuropathy   . Obesity   . Vitamin D deficiency disease     Current Outpatient Medications:  .  amLODipine-olmesartan (AZOR) 5-40 MG tablet, TAKE 1 TABLET BY MOUTH DAILY, Disp: 90 tablet, Rfl: 1 .  aspirin 81 MG chewable tablet, Chew 81 mg by mouth daily., Disp: , Rfl:  .  atorvastatin (LIPITOR) 40 MG tablet, TAKE 1 TABLET BY MOUTH DAILY, Disp: 90 tablet, Rfl: 1 .  betamethasone, augmented, (DIPROLENE) 0.05 % lotion, APPLY TOPICALLY 2 (TWO) TIMES DAILY., Disp: 60 mL, Rfl: 2 .  blood glucose meter kit and supplies, Dispense based on patient and insurance preference. Use up to one time daily as directed.  (FOR ICD-10 E10.9, E11.9)., Disp: 1 each, Rfl: 0 .  cholecalciferol (VITAMIN D) 1000 UNITS tablet, Take 2,000 Units by mouth daily., Disp: , Rfl:  .  furosemide (LASIX) 20 MG tablet, Take 1 tablet (20 mg total) by mouth daily., Disp: 90 tablet, Rfl: 3 .  glucose blood test strip, UAD to test BG once daily, Disp: 100 each, Rfl: 3 .  griseofulvin (GRIFULVIN V) 500 MG tablet, Take 1 tablet (500 mg total) by mouth daily., Disp: 30 tablet, Rfl: 0 .  Lancet Device MISC, Use to test BG once daily, Disp: 100 each, Rfl: 3 .  metFORMIN (GLUCOPHAGE) 500 MG tablet, TAKE 2 TABLETS QAM with breakfast and 1 tablet with supper, Disp: 270 tablet, Rfl: 0  Assessment/ Plan: 59 y.o. female   1. Tinea cruris Ongoing rash.  I'm somewhat concerned that this rash is ongoing.  We discussed the possibility that we may have the wrong diagnosis.  I am going to extend her griseofulvin for 2 additional weeks but advised her to go ahead and contact her dermatologist to get a checkup.  May need a skin scraping to confirm diagnosis.  She was good understanding of the plan and will follow up in 2 months for follow-up of diabetes - griseofulvin (GRIFULVIN V) 500 MG tablet; Take 1 tablet (500 mg total) by mouth daily.  Dispense: 14 tablet; Refill: 0   Start time: 3:30 (called no answer); 3:41pm (can't talk because she's in a car  with someone); 4:52pm End time: 4:59pm  Total time spent on patient care (including telephone call/ virtual visit): 15 minutes  [place], [place] [phone]

## 2020-04-14 ENCOUNTER — Other Ambulatory Visit: Payer: Self-pay | Admitting: Family Medicine

## 2020-04-14 DIAGNOSIS — B356 Tinea cruris: Secondary | ICD-10-CM

## 2020-04-17 ENCOUNTER — Other Ambulatory Visit: Payer: Self-pay | Admitting: Family Medicine

## 2020-04-17 NOTE — Telephone Encounter (Signed)
Left detailed message on VM.

## 2020-04-17 NOTE — Telephone Encounter (Signed)
Refill not appropriate.  If rash ongoing, needs to be seen by [facility]

## 2020-04-18 NOTE — Telephone Encounter (Signed)
LMOVM at [phone], having network difficulties calling her [phone] number

## 2020-04-20 ENCOUNTER — Other Ambulatory Visit: Payer: Self-pay | Admitting: Family Medicine

## 2020-04-20 DIAGNOSIS — L661 Lichen planopilaris: Secondary | ICD-10-CM

## 2020-04-21 NOTE — Telephone Encounter (Signed)
Tried calling [phone] number still having network difficulties, LMOVM at [phone]

## 2020-05-04 DIAGNOSIS — R21 Rash and other nonspecific skin eruption: Secondary | ICD-10-CM | POA: Diagnosis not present

## 2020-05-11 ENCOUNTER — Other Ambulatory Visit: Payer: Self-pay | Admitting: Family Medicine

## 2020-05-11 DIAGNOSIS — E1165 Type 2 diabetes mellitus with hyperglycemia: Secondary | ICD-10-CM

## 2020-05-13 ENCOUNTER — Other Ambulatory Visit: Payer: Self-pay | Admitting: Family Medicine

## 2020-05-13 DIAGNOSIS — E785 Hyperlipidemia, unspecified: Secondary | ICD-10-CM

## 2020-05-13 DIAGNOSIS — I152 Hypertension secondary to endocrine disorders: Secondary | ICD-10-CM

## 2020-05-13 DIAGNOSIS — E1159 Type 2 diabetes mellitus with other circulatory complications: Secondary | ICD-10-CM

## 2020-05-16 ENCOUNTER — Encounter: Payer: Self-pay | Admitting: *Deleted

## 2020-05-25 ENCOUNTER — Telehealth: Payer: Self-pay | Admitting: Pharmacist

## 2020-05-25 ENCOUNTER — Ambulatory Visit: Payer: BC Managed Care – PPO | Admitting: Family Medicine

## 2020-05-25 ENCOUNTER — Other Ambulatory Visit: Payer: Self-pay

## 2020-05-25 ENCOUNTER — Encounter: Payer: Self-pay | Admitting: Family Medicine

## 2020-05-25 VITALS — BP 133/84 | HR 91 | Temp 97.1°F | Ht 60.0 in | Wt 251.8 lb

## 2020-05-25 DIAGNOSIS — I152 Hypertension secondary to endocrine disorders: Secondary | ICD-10-CM

## 2020-05-25 DIAGNOSIS — E1159 Type 2 diabetes mellitus with other circulatory complications: Secondary | ICD-10-CM

## 2020-05-25 DIAGNOSIS — E1142 Type 2 diabetes mellitus with diabetic polyneuropathy: Secondary | ICD-10-CM | POA: Diagnosis not present

## 2020-05-25 DIAGNOSIS — E785 Hyperlipidemia, unspecified: Secondary | ICD-10-CM | POA: Diagnosis not present

## 2020-05-25 DIAGNOSIS — E1165 Type 2 diabetes mellitus with hyperglycemia: Secondary | ICD-10-CM | POA: Diagnosis not present

## 2020-05-25 DIAGNOSIS — I10 Essential (primary) hypertension: Secondary | ICD-10-CM | POA: Diagnosis not present

## 2020-05-25 DIAGNOSIS — E1169 Type 2 diabetes mellitus with other specified complication: Secondary | ICD-10-CM

## 2020-05-25 LAB — BAYER DCA HB A1C WAIVED: HB A1C (BAYER DCA - WAIVED): 7.5 % — ABNORMAL HIGH (ref <7.0)

## 2020-05-25 MED ORDER — METFORMIN HCL 500 MG PO TABS: 1000.0000 mg | ORAL_TABLET | Freq: Two times a day (BID) | ORAL | 0 refills | Status: DC

## 2020-05-25 MED ORDER — ACCU-CHEK SOFTCLIX LANCETS MISC: 12 refills | Status: AC

## 2020-05-25 MED ORDER — ACCU-CHEK GUIDE VI STRP: ORAL_STRIP | 12 refills | Status: DC

## 2020-05-25 NOTE — Progress Notes (Signed)
Subjective: CC: DM2 PCP: Carla Norlander, DO YJE:Carla Mason is a 59 y.o. female presenting to clinic today for:  1. Type 2 Diabetes w/ HTN/ HLD Metformin was increased to 1061m qam and continued on 5019mqpm.  Taking medication(s): Metformin, Azor, Lipitor.  Tinea cleared.  Dermatitis improved after topical corticosteroid.  No chest pain, shortness of breath, polydipsia, polyuria, edema or visual disturbance  Last eye exam: Needs Last foot exam:  UTD Last A1c:  Lab Results  Component Value Date   HGBA1C 7.5 (H) 02/18/2020   Nephropathy screen indicated?: on ARB Last flu, zoster and/or pneumovax: PNA vaccine declined  No Known Allergies Past Medical History:  Diagnosis Date  . Diabetes [place] ([place]  . Hyperlipidemia   . Hypertension   . Neuropathy   . Obesity   . Vitamin D deficiency disease     Current Outpatient Medications:  .  amLODipine-olmesartan (AZOR) 5-40 MG tablet, TAKE 1 TABLET BY MOUTH DAILY, Disp: 90 tablet, Rfl: 0 .  aspirin 81 MG chewable tablet, Chew 81 mg by mouth daily., Disp: , Rfl:  .  atorvastatin (LIPITOR) 40 MG tablet, TAKE 1 TABLET BY MOUTH DAILY, Disp: 90 tablet, Rfl: 0 .  betamethasone, augmented, (DIPROLENE) 0.05 % lotion, APPLY TO AFFECTED AREA TWICE A DAY, Disp: 60 mL, Rfl: 2 .  blood glucose meter kit and supplies, Dispense based on patient and insurance preference. Use up to one time daily as directed. (FOR ICD-10 E10.9, E11.9)., Disp: 1 each, Rfl: 0 .  cholecalciferol (VITAMIN D) 1000 UNITS tablet, Take 2,000 Units by mouth daily., Disp: , Rfl:  .  furosemide (LASIX) 20 MG tablet, Take 1 tablet (20 mg total) by mouth daily., Disp: 90 tablet, Rfl: 3 .  glucose blood test strip, UAD to test BG once daily, Disp: 100 each, Rfl: 3 .  griseofulvin (GRIFULVIN V) 500 MG tablet, Take 1 tablet (500 mg total) by mouth daily., Disp: 14 tablet, Rfl: 0 .  Lancet Device MISC, Use to test BG once daily, Disp: 100 each, Rfl:  3 .  metFORMIN (GLUCOPHAGE) 500 MG tablet, TAKE 2 TABLETS IN THE MORNING WITH BREAKFAST AND TAKE 1 TABLET WITH SUPPER, Disp: 270 tablet, Rfl: 0 Social History   Socioeconomic History  . Marital status: Married    Spouse name: Not on file  . Number of children: Not on file  . Years of education: Not on file  . Highest education level: Not on file  Occupational History  . Not on file  Tobacco Use  . Smoking status: Never Smoker  . Smokeless tobacco: Never Used  Vaping Use  . Vaping Use: Never used  Substance and Sexual Activity  . Alcohol use: Never  . Drug use: Never  . Sexual activity: Not on file  Other Topics Concern  . Not on file  Social History Narrative  . Not on file   Social Determinants of Health   Financial Resource Strain:   . Difficulty of Paying Living Expenses: Not on file  Food Insecurity:   . Worried About RuCharity fundraisern the Last Year: Not on file  . Ran Out of Food in the Last Year: Not on file  Transportation Needs:   . Lack of Transportation (Medical): Not on file  . Lack of Transportation (Non-Medical): Not on file  Physical Activity:   . Days of Exercise per Week: Not on file  . Minutes of Exercise per Session: Not on file  Stress:   .  Feeling of Stress : Not on file  Social Connections:   . Frequency of Communication with Friends and Family: Not on file  . Frequency of Social Gatherings with Friends and Family: Not on file  . Attends Religious Services: Not on file  . Active Member of Clubs or Organizations: Not on file  . Attends Archivist Meetings: Not on file  . Marital Status: Not on file  Intimate Partner Violence:   . Fear of Current or Ex-Partner: Not on file  . Emotionally Abused: Not on file  . Physically Abused: Not on file  . Sexually Abused: Not on file   Family History  Problem Relation Age of Onset  . Heart attack Mother   . Cancer Father   . CVA Maternal Grandmother   . Heart attack Maternal Grandfather    . Cancer Sister   . Early death Brother   . Heart attack Sister   . Healthy Sister     Objective: Office vital signs reviewed. BP 133/84   Pulse 91   Temp (!) 97.1 F (36.2 C)   Ht 5' (1.524 m)   Wt 251 lb 12.8 oz (114.2 kg)   SpO2 95%   BMI 49.18 kg/m   Physical Examination:  General: Awake, alert, obese, No acute distress HEENT: sclera white, MMM Cardio: regular rate and rhythm, S1S2 heard, 2/6 SEM appreciated at bilateral sternal borders Pulm: clear to auscultation bilaterally, no wheezes, rhonchi or rales; normal work of breathing on room air Extremities: warm, well perfused, No edema, cyanosis or clubbing; +2 pulses bilaterally MSK: stiff gait and station  Assessment/ Plan: 59 y.o. female   1. Uncontrolled type 2 diabetes mellitus with hyperglycemia (HCC) Continues to be uncontrolled with A1c of 7.5.  Increase Metformin to 2 tablets twice daily.  Recheck in 3 months, sooner if needed.  She was seen by Almyra Free today to show how to use new meter.  Encouraged her to check her blood sugars at least 1-2 times weekly. - Bayer DCA Hb A1c Waived - CMP14+EGFR - metFORMIN (GLUCOPHAGE) 500 MG tablet; Take 2 tablets (1,000 mg total) by mouth 2 (two) times daily with a meal.  Dispense: 270 tablet; Refill: 0  2. Hypertension associated with diabetes ([place]) Controlled - CMP14+EGFR  3. Hyperlipidemia associated with type 2 diabetes mellitus ([place]) Continue statin - CMP14+EGFR   No orders of the defined types were placed in this encounter.  No orders of the defined types were placed in this encounter.    Carla Norlander, DO [place] [phone]

## 2020-05-25 NOTE — Telephone Encounter (Signed)
GLUCOMETER DEMONSTRATION ACCUCHEK TEST STRIPS AND LANCETS CALLED IN FOR PATIENT

## 2020-05-26 ENCOUNTER — Ambulatory Visit: Payer: Self-pay | Admitting: Family Medicine

## 2020-05-26 LAB — CMP14+EGFR
ALT: 31 IU/L (ref 0–32)
AST: 20 IU/L (ref 0–40)
Albumin/Globulin Ratio: 2 (ref 1.2–2.2)
Albumin: 4.3 g/dL (ref 3.8–4.9)
Alkaline Phosphatase: 104 IU/L (ref 48–121)
BUN/Creatinine Ratio: 18 (ref 12–28)
BUN: 10 mg/dL (ref 8–27)
Bilirubin Total: 0.7 mg/dL (ref 0.0–1.2)
CO2: 26 mmol/L (ref 20–29)
Calcium: 9.3 mg/dL (ref 8.7–10.3)
Chloride: 102 mmol/L (ref 96–106)
Creatinine, Ser: 0.57 mg/dL (ref 0.57–1.00)
GFR calc Af Amer: 117 mL/min/{1.73_m2} (ref >59)
GFR calc non Af Amer: 101 mL/min/{1.73_m2} (ref >59)
Globulin, Total: 2.2 g/dL (ref 1.5–4.5)
Glucose: 151 mg/dL — ABNORMAL HIGH (ref 65–99)
Potassium: 4.1 mmol/L (ref 3.5–5.2)
Sodium: 141 mmol/L (ref 134–144)
Total Protein: 6.5 g/dL (ref 6.0–8.5)

## 2020-06-01 DIAGNOSIS — L304 Erythema intertrigo: Secondary | ICD-10-CM | POA: Diagnosis not present

## 2020-08-10 ENCOUNTER — Other Ambulatory Visit: Payer: Self-pay | Admitting: Family Medicine

## 2020-08-10 DIAGNOSIS — E785 Hyperlipidemia, unspecified: Secondary | ICD-10-CM

## 2020-08-10 DIAGNOSIS — I152 Hypertension secondary to endocrine disorders: Secondary | ICD-10-CM

## 2020-08-21 ENCOUNTER — Other Ambulatory Visit: Payer: Self-pay | Admitting: Family Medicine

## 2020-08-21 DIAGNOSIS — E1165 Type 2 diabetes mellitus with hyperglycemia: Secondary | ICD-10-CM

## 2020-08-30 ENCOUNTER — Ambulatory Visit: Payer: BC Managed Care – PPO | Admitting: Family Medicine

## 2020-08-30 ENCOUNTER — Encounter: Payer: Self-pay | Admitting: Family Medicine

## 2020-08-30 ENCOUNTER — Other Ambulatory Visit: Payer: Self-pay

## 2020-08-30 VITALS — BP 124/75 | HR 85 | Temp 97.8°F | Ht 60.0 in | Wt 247.8 lb

## 2020-08-30 DIAGNOSIS — Z1211 Encounter for screening for malignant neoplasm of colon: Secondary | ICD-10-CM | POA: Diagnosis not present

## 2020-08-30 DIAGNOSIS — E1165 Type 2 diabetes mellitus with hyperglycemia: Secondary | ICD-10-CM

## 2020-08-30 DIAGNOSIS — E785 Hyperlipidemia, unspecified: Secondary | ICD-10-CM

## 2020-08-30 DIAGNOSIS — E1159 Type 2 diabetes mellitus with other circulatory complications: Secondary | ICD-10-CM

## 2020-08-30 DIAGNOSIS — E1169 Type 2 diabetes mellitus with other specified complication: Secondary | ICD-10-CM

## 2020-08-30 DIAGNOSIS — I152 Hypertension secondary to endocrine disorders: Secondary | ICD-10-CM

## 2020-08-30 LAB — BAYER DCA HB A1C WAIVED: HB A1C (BAYER DCA - WAIVED): 7.1 % — ABNORMAL HIGH (ref <7.0)

## 2020-08-30 MED ORDER — METFORMIN HCL 500 MG PO TABS: 1000.0000 mg | ORAL_TABLET | Freq: Two times a day (BID) | ORAL | 3 refills | Status: DC

## 2020-08-30 NOTE — Patient Instructions (Signed)
Carla Mason

## 2020-08-30 NOTE — Progress Notes (Signed)
Subjective: CC: DM PCP: [name], DO Carla Mason is a 59 y.o. female presenting to clinic today for:  1. Type 2 Diabetes with hypertension, hyperlipidemia:  Patient reports compliance with Metformin 1000 mg twice daily.  She is compliant with Lipitor and amlodipine-olmesartan.  No chest pain, shortness of breath.  No visual disturbance, sensory changes.  Last eye exam: Needs Last foot exam: Needs Last A1c:  Lab Results  Component Value Date   HGBA1C 7.5 (H) 05/25/2020   Nephropathy screen indicated?:  On ARB Last flu, zoster and/or pneumovax:  Immunization History  Administered Date(s) Administered  . Influenza-Unspecified 06/23/2017, 07/05/2020  . PFIZER SARS-COV-2 Vaccination 06/03/2020, 06/23/2020  . Td 05/15/2011  . Tdap 05/15/2011    ROS: Per HPI  No Known Allergies Past Medical History:  Diagnosis Date  . Diabetes mellitus without complication ([place])   . Hyperlipidemia   . Hypertension   . Neuropathy   . Obesity   . Vitamin D deficiency disease     Current Outpatient Medications:  .  Accu-Chek Softclix Lancets lancets, Use as instructed, Disp: 100 each, Rfl: 12 .  amLODipine-olmesartan (AZOR) 5-40 MG tablet, TAKE 1 TABLET BY MOUTH EVERY DAY, Disp: 90 tablet, Rfl: 0 .  aspirin 81 MG chewable tablet, Chew 81 mg by mouth daily., Disp: , Rfl:  .  atorvastatin (LIPITOR) 40 MG tablet, TAKE 1 TABLET BY MOUTH EVERY DAY, Disp: 90 tablet, Rfl: 0 .  betamethasone, augmented, (DIPROLENE) 0.05 % lotion, APPLY TO AFFECTED AREA TWICE A DAY, Disp: 60 mL, Rfl: 2 .  blood glucose meter kit and supplies, Dispense based on patient and insurance preference. Use up to one time daily as directed. (FOR ICD-10 E10.9, E11.9)., Disp: 1 each, Rfl: 0 .  cholecalciferol (VITAMIN D) 1000 UNITS tablet, Take 2,000 Units by mouth daily., Disp: , Rfl:  .  furosemide (LASIX) 20 MG tablet, Take 1 tablet (20 mg total) by mouth daily., Disp: 90 tablet, Rfl: 3 .  glucose blood  (ACCU-CHEK GUIDE) test strip, USE TEST BLOOD SUGAR DAILY. DX: E11.9, Disp: 100 each, Rfl: 12 .  griseofulvin (GRIFULVIN V) 500 MG tablet, Take 1 tablet (500 mg total) by mouth daily., Disp: 14 tablet, Rfl: 0 .  Lancet Device MISC, Use to test BG once daily, Disp: 100 each, Rfl: 3 .  metFORMIN (GLUCOPHAGE) 500 MG tablet, TAKE 2 TABLETS IN THE MORNING WITH BREAKFAST AND TAKE 1 TABLET WITH SUPPER, Disp: 270 tablet, Rfl: 0 Social History   Socioeconomic History  . Marital status: Married    Spouse name: Not on file  . Number of children: Not on file  . Years of education: Not on file  . Highest education level: Not on file  Occupational History  . Not on file  Tobacco Use  . Smoking status: Never Smoker  . Smokeless tobacco: Never Used  Vaping Use  . Vaping Use: Never used  Substance and Sexual Activity  . Alcohol use: Never  . Drug use: Never  . Sexual activity: Not on file  Other Topics Concern  . Not on file  Social History Narrative  . Not on file   Social Determinants of Health   Financial Resource Strain:   . Difficulty of Paying Living Expenses: Not on file  Food Insecurity:   . Worried About Charity fundraiser in the Last Year: Not on file  . Ran Out of Food in the Last Year: Not on file  Transportation Needs:   . Lack  of Transportation (Medical): Not on file  . Lack of Transportation (Non-Medical): Not on file  Physical Activity:   . Days of Exercise per Week: Not on file  . Minutes of Exercise per Session: Not on file  Stress:   . Feeling of Stress : Not on file  Social Connections:   . Frequency of Communication with Friends and Family: Not on file  . Frequency of Social Gatherings with Friends and Family: Not on file  . Attends Religious Services: Not on file  . Active Member of Clubs or Organizations: Not on file  . Attends Archivist Meetings: Not on file  . Marital Status: Not on file  Intimate Partner Violence:   . Fear of Current or  Ex-Partner: Not on file  . Emotionally Abused: Not on file  . Physically Abused: Not on file  . Sexually Abused: Not on file   Family History  Problem Relation Age of Onset  . Heart attack Mother   . Cancer Father   . CVA Maternal Grandmother   . Heart attack Maternal Grandfather   . Cancer Sister   . Early death Brother   . Heart attack Sister   . Healthy Sister     Objective: Office vital signs reviewed. BP 124/75   Pulse 85   Temp 97.8 F (36.6 C)   Ht 5' (1.524 m)   Wt 247 lb 12.8 oz (112.4 kg)   SpO2 93%   BMI 48.40 kg/m   Physical Examination:  General: Awake, alert, obese, No acute distress HEENT: Normal, sclera white, MMM Cardio: regular rate and rhythm, M7E6 heard, systolic murmur Pulm: clear to auscultation bilaterally, no wheezes, rhonchi or rales; normal work of breathing on room air Skin: dry; intact; no rashes or lesions Neuro: see DM foot  Diabetic Foot Exam - Simple   Simple Foot Form Diabetic Foot exam was performed with the following findings: Yes 08/30/2020  5:48 PM  Visual Inspection No deformities, no ulcerations, no other skin breakdown bilaterally: Yes Sensation Testing Intact to touch and monofilament testing bilaterally: Yes Pulse Check Posterior Tibialis and Dorsalis pulse intact bilaterally: Yes Comments    Assessment/ Plan: 59 y.o. female   Uncontrolled type 2 diabetes mellitus with hyperglycemia (HCC) - Plan: Bayer DCA Hb A1c Waived, metFORMIN (GLUCOPHAGE) 500 MG tablet  Hypertension associated with diabetes ([place]) - Plan: CMP14+EGFR  Hyperlipidemia associated with type 2 diabetes mellitus ([place]) - Plan: CANCELED: Lipid panel  Sugars improving but still technically uncontrolled A1c of 7.1.  Continue Metformin 1000 mg twice daily.  Reinforced carb restriction.  We will give her another 3 months before making any other medication adjustments.  Neck step will likely be Januvia  Blood pressure well controlled.  Continue current  regimen  Not fasting so lipid panel was canceled.  Continue statin  Orders Placed This Encounter  Procedures  . Bayer DCA Hb A1c Waived  . CMP14+EGFR  . Lipid panel   No orders of the defined types were placed in this encounter.    [name], DO [place] [phone]

## 2020-08-31 LAB — CMP14+EGFR
ALT: 25 IU/L (ref 0–32)
AST: 18 IU/L (ref 0–40)
Albumin/Globulin Ratio: 2.2 (ref 1.2–2.2)
Albumin: 4.4 g/dL (ref 3.8–4.9)
Alkaline Phosphatase: 93 IU/L (ref 44–121)
BUN/Creatinine Ratio: 17 (ref 12–28)
BUN: 9 mg/dL (ref 8–27)
Bilirubin Total: 0.5 mg/dL (ref 0.0–1.2)
CO2: 26 mmol/L (ref 20–29)
Calcium: 9.2 mg/dL (ref 8.7–10.3)
Chloride: 99 mmol/L (ref 96–106)
Creatinine, Ser: 0.54 mg/dL — ABNORMAL LOW (ref 0.57–1.00)
GFR calc Af Amer: 119 mL/min/{1.73_m2} (ref >59)
GFR calc non Af Amer: 103 mL/min/{1.73_m2} (ref >59)
Globulin, Total: 2 g/dL (ref 1.5–4.5)
Glucose: 165 mg/dL — ABNORMAL HIGH (ref 65–99)
Potassium: 3.9 mmol/L (ref 3.5–5.2)
Sodium: 139 mmol/L (ref 134–144)
Total Protein: 6.4 g/dL (ref 6.0–8.5)

## 2020-09-01 ENCOUNTER — Encounter: Payer: Self-pay | Admitting: *Deleted

## 2020-10-04 ENCOUNTER — Other Ambulatory Visit: Payer: Self-pay | Admitting: Family Medicine

## 2020-10-04 DIAGNOSIS — E1165 Type 2 diabetes mellitus with hyperglycemia: Secondary | ICD-10-CM

## 2020-11-03 ENCOUNTER — Other Ambulatory Visit: Payer: Self-pay | Admitting: Family Medicine

## 2020-11-03 DIAGNOSIS — E1159 Type 2 diabetes mellitus with other circulatory complications: Secondary | ICD-10-CM

## 2020-11-03 DIAGNOSIS — E785 Hyperlipidemia, unspecified: Secondary | ICD-10-CM

## 2020-11-03 DIAGNOSIS — I152 Hypertension secondary to endocrine disorders: Secondary | ICD-10-CM

## 2020-11-27 ENCOUNTER — Other Ambulatory Visit: Payer: Self-pay

## 2020-11-27 ENCOUNTER — Ambulatory Visit: Payer: BC Managed Care – PPO | Admitting: Family Medicine

## 2020-11-27 VITALS — BP 111/68 | HR 68 | Temp 97.8°F | Ht 60.0 in | Wt 247.0 lb

## 2020-11-27 DIAGNOSIS — E1165 Type 2 diabetes mellitus with hyperglycemia: Secondary | ICD-10-CM | POA: Diagnosis not present

## 2020-11-27 DIAGNOSIS — E785 Hyperlipidemia, unspecified: Secondary | ICD-10-CM

## 2020-11-27 DIAGNOSIS — E1169 Type 2 diabetes mellitus with other specified complication: Secondary | ICD-10-CM | POA: Diagnosis not present

## 2020-11-27 DIAGNOSIS — E1159 Type 2 diabetes mellitus with other circulatory complications: Secondary | ICD-10-CM

## 2020-11-27 DIAGNOSIS — I152 Hypertension secondary to endocrine disorders: Secondary | ICD-10-CM

## 2020-11-27 LAB — BAYER DCA HB A1C WAIVED: HB A1C (BAYER DCA - WAIVED): 7.2 % — ABNORMAL HIGH (ref <7.0)

## 2020-11-27 MED ORDER — JANUMET XR 100-1000 MG PO TB24: 1.0000 | ORAL_TABLET | Freq: Every day | ORAL | 12 refills | Status: DC

## 2020-11-27 NOTE — Progress Notes (Signed)
Subjective: CC: DM PCP: [name], DO YHC:WCBJS Carla Mason is a 60 y.o. female presenting to clinic today for:  1. Type 2 Diabetes with hypertension, hyperlipidemia:  Admits that she is not religiously checking her blood sugar.  She does not report any substantial changes in diet or exercise.  She is compliant with her Metformin 1000 mg each morning and 500 mg each evening.  No reports of chest pain, shortness of breath, edema or falls.  She wears compression hose at work which helps.  She is compliant with her Lipitor, Azor and Lasix  Last eye exam: Needs.  Has not schedule Last foot exam: Up-to-date Last A1c:  Lab Results  Component Value Date   HGBA1C 7.1 (H) 08/30/2020   Nephropathy screen indicated?:  On ARB Last flu, zoster and/or pneumovax:  Immunization History  Administered Date(s) Administered  . Influenza-Unspecified 06/23/2017, 07/05/2020  . PFIZER(Purple Top)SARS-COV-2 Vaccination 06/03/2020, 06/23/2020  . Td 05/15/2011  . Tdap 05/15/2011     ROS: Per HPI  No Known Allergies Past Medical History:  Diagnosis Date  . Diabetes mellitus without complication ([place])   . Hyperlipidemia   . Hypertension   . Neuropathy   . Obesity   . Vitamin D deficiency disease     Current Outpatient Medications:  .  Accu-Chek Softclix Lancets lancets, Use as instructed, Disp: 100 each, Rfl: 12 .  amLODipine-olmesartan (AZOR) 5-40 MG tablet, TAKE 1 TABLET BY MOUTH EVERY DAY, Disp: 90 tablet, Rfl: 0 .  aspirin 81 MG chewable tablet, Chew 81 mg by mouth daily., Disp: , Rfl:  .  atorvastatin (LIPITOR) 40 MG tablet, TAKE 1 TABLET BY MOUTH EVERY DAY, Disp: 90 tablet, Rfl: 0 .  betamethasone, augmented, (DIPROLENE) 0.05 % lotion, APPLY TO AFFECTED AREA TWICE A DAY, Disp: 60 mL, Rfl: 2 .  blood glucose meter kit and supplies, Dispense based on patient and insurance preference. Use up to one time daily as directed. (FOR ICD-10 E10.9, E11.9)., Disp: 1 each, Rfl: 0 .   cholecalciferol (VITAMIN D) 1000 UNITS tablet, Take 2,000 Units by mouth daily., Disp: , Rfl:  .  furosemide (LASIX) 20 MG tablet, Take 1 tablet (20 mg total) by mouth daily., Disp: 90 tablet, Rfl: 3 .  glucose blood (ACCU-CHEK GUIDE) test strip, USE TEST BLOOD SUGAR DAILY. DX: E11.9, Disp: 100 each, Rfl: 12 .  Lancet Device MISC, Use to test BG once daily, Disp: 100 each, Rfl: 3 .  metFORMIN (GLUCOPHAGE) 500 MG tablet, TAKE 2 TABLETS IN THE MORNING WITH BREAKFAST AND TAKE 1 TABLET WITH SUPPER, Disp: 270 tablet, Rfl: 0 Social History   Socioeconomic History  . Marital status: Married    Spouse name: Not on file  . Number of children: Not on file  . Years of education: Not on file  . Highest education level: Not on file  Occupational History  . Not on file  Tobacco Use  . Smoking status: Never Smoker  . Smokeless tobacco: Never Used  Vaping Use  . Vaping Use: Never used  Substance and Sexual Activity  . Alcohol use: Never  . Drug use: Never  . Sexual activity: Not on file  Other Topics Concern  . Not on file  Social History Narrative  . Not on file   Social Determinants of Health   Financial Resource Strain: Not on file  Food Insecurity: Not on file  Transportation Needs: Not on file  Physical Activity: Not on file  Stress: Not on file  Social  Connections: Not on file  Intimate Partner Violence: Not on file   Family History  Problem Relation Age of Onset  . Heart attack Mother   . Cancer Father   . CVA Maternal Grandmother   . Heart attack Maternal Grandfather   . Cancer Sister   . Early death Brother   . Heart attack Sister   . Healthy Sister     Objective: Office vital signs reviewed. BP 111/68   Pulse 68   Temp 97.8 F (36.6 C) (Temporal)   Ht 5' (1.524 m)   Wt 247 lb (112 kg)   SpO2 93%   BMI 48.24 kg/m   Physical Examination:  General: Awake, alert, well nourished, No acute distress HEENT: Normal; sclera white.  Moist mucous membranes.  No carotid  bruits Cardio: regular rate and rhythm, S1S2 heard, no murmurs appreciated Pulm: clear to auscultation bilaterally, no wheezes, rhonchi or rales; normal work of breathing on room air Extremities: warm, well perfused, No edema, cyanosis or clubbing; +2 pulses bilaterally; compression hose in place  Assessment/ Plan: 60 y.o. female   Uncontrolled type 2 diabetes mellitus with hyperglycemia ([place]) - Plan: Bayer DCA Hb A1c Waived, SitaGLIPtin-MetFORMIN HCl (JANUMET XR) [phone] MG TB24  Hypertension associated with diabetes ([place])  Hyperlipidemia associated with type 2 diabetes mellitus ([place])  Sugar remains uncontrolled with A1c rising to 7.2.  I have added Januvia to her Metformin and sent this in as Janumet XR [phone] mg daily.  We discussed that this would replace her Metformin.  I would like to see her back in 3 months.  1 month sample has been provided to the patient and new Rx has been sent to pharmacy.  She will contact me if the cost is too high, at which point I will refer her to patient assistance for this medicine  Blood pressures controlled.  Continue current regimen  Continue statin  Orders Placed This Encounter  Procedures  . Bayer DCA Hb A1c Waived   No orders of the defined types were placed in this encounter.    [name], DO [place] [phone]

## 2021-01-08 ENCOUNTER — Telehealth: Payer: Self-pay

## 2021-01-08 DIAGNOSIS — E1165 Type 2 diabetes mellitus with hyperglycemia: Secondary | ICD-10-CM

## 2021-01-08 MED ORDER — BLOOD GLUCOSE METER KIT: PACK | 0 refills | Status: AC

## 2021-01-08 NOTE — Telephone Encounter (Signed)
Patient is privately insured.  Please give her manufacturer coupon for Janumet.  If none in closet, please help her get one offline.  Will send in Meter.

## 2021-01-08 NOTE — Telephone Encounter (Signed)
Spoke with patient and advised that meter has been sent to pharmacy and we will try and get her a coupon for Janumet.

## 2021-01-09 NOTE — Telephone Encounter (Signed)
I spoke with Janumet assist and they state that patient needs to call [phone] and ask to enroll in the janumet assist program with the group number [id number] and they will help get her a coupon.

## 2021-01-29 ENCOUNTER — Other Ambulatory Visit: Payer: Self-pay | Admitting: Family Medicine

## 2021-01-29 DIAGNOSIS — E1159 Type 2 diabetes mellitus with other circulatory complications: Secondary | ICD-10-CM

## 2021-01-29 DIAGNOSIS — I152 Hypertension secondary to endocrine disorders: Secondary | ICD-10-CM

## 2021-01-29 DIAGNOSIS — E785 Hyperlipidemia, unspecified: Secondary | ICD-10-CM

## 2021-01-30 ENCOUNTER — Telehealth: Payer: Self-pay | Admitting: Family Medicine

## 2021-01-30 MED ORDER — FREESTYLE LIBRE 2 READER DEVI: 0 refills | Status: DC

## 2021-01-30 MED ORDER — FREESTYLE LIBRE 2 SENSOR MISC: 12 refills | Status: DC

## 2021-01-30 NOTE — Telephone Encounter (Signed)
Pt is supposed to be getting free style Carla Mason and wants to know if she is supposed to come here or if it will be getting sent to pharmacy

## 2021-03-02 ENCOUNTER — Encounter: Payer: Self-pay | Admitting: Family Medicine

## 2021-03-02 ENCOUNTER — Ambulatory Visit: Payer: BC Managed Care – PPO | Admitting: Family Medicine

## 2021-03-02 ENCOUNTER — Other Ambulatory Visit: Payer: Self-pay

## 2021-03-02 VITALS — BP 120/70 | HR 69 | Temp 97.4°F | Ht 61.0 in | Wt 240.0 lb

## 2021-03-02 DIAGNOSIS — L661 Lichen planopilaris: Secondary | ICD-10-CM

## 2021-03-02 DIAGNOSIS — E1169 Type 2 diabetes mellitus with other specified complication: Secondary | ICD-10-CM | POA: Diagnosis not present

## 2021-03-02 DIAGNOSIS — R609 Edema, unspecified: Secondary | ICD-10-CM

## 2021-03-02 DIAGNOSIS — E1159 Type 2 diabetes mellitus with other circulatory complications: Secondary | ICD-10-CM

## 2021-03-02 DIAGNOSIS — E1165 Type 2 diabetes mellitus with hyperglycemia: Secondary | ICD-10-CM | POA: Diagnosis not present

## 2021-03-02 DIAGNOSIS — I152 Hypertension secondary to endocrine disorders: Secondary | ICD-10-CM | POA: Diagnosis not present

## 2021-03-02 DIAGNOSIS — E785 Hyperlipidemia, unspecified: Secondary | ICD-10-CM

## 2021-03-02 LAB — LIPID PANEL
Chol/HDL Ratio: 3.7 ratio (ref 0.0–4.4)
Cholesterol, Total: 121 mg/dL (ref 100–199)
HDL: 33 mg/dL — ABNORMAL LOW (ref >39)
LDL Chol Calc (NIH): 58 mg/dL (ref 0–99)
Triglycerides: 181 mg/dL — ABNORMAL HIGH (ref 0–149)
VLDL Cholesterol Cal: 30 mg/dL (ref 5–40)

## 2021-03-02 LAB — BAYER DCA HB A1C WAIVED: HB A1C (BAYER DCA - WAIVED): 6.6 % (ref <7.0)

## 2021-03-02 LAB — CMP14+EGFR
ALT: 21 IU/L (ref 0–32)
AST: 12 IU/L (ref 0–40)
Albumin/Globulin Ratio: 1.9 (ref 1.2–2.2)
Albumin: 4.3 g/dL (ref 3.8–4.8)
Alkaline Phosphatase: 99 IU/L (ref 44–121)
BUN/Creatinine Ratio: 16 (ref 12–28)
BUN: 9 mg/dL (ref 8–27)
Bilirubin Total: 0.6 mg/dL (ref 0.0–1.2)
CO2: 25 mmol/L (ref 20–29)
Calcium: 9.1 mg/dL (ref 8.7–10.3)
Chloride: 100 mmol/L (ref 96–106)
Creatinine, Ser: 0.56 mg/dL — ABNORMAL LOW (ref 0.57–1.00)
Globulin, Total: 2.3 g/dL (ref 1.5–4.5)
Glucose: 139 mg/dL — ABNORMAL HIGH (ref 65–99)
Potassium: 4.3 mmol/L (ref 3.5–5.2)
Sodium: 140 mmol/L (ref 134–144)
Total Protein: 6.6 g/dL (ref 6.0–8.5)
eGFR: 104 mL/min/{1.73_m2} (ref >59)

## 2021-03-02 MED ORDER — AMLODIPINE-OLMESARTAN 5-40 MG PO TABS: 1.0000 | ORAL_TABLET | Freq: Every day | ORAL | 1 refills | Status: DC

## 2021-03-02 MED ORDER — FUROSEMIDE 20 MG PO TABS: 20.0000 mg | ORAL_TABLET | Freq: Every day | ORAL | 3 refills | Status: DC

## 2021-03-02 MED ORDER — ATORVASTATIN CALCIUM 40 MG PO TABS: 1.0000 | ORAL_TABLET | Freq: Every day | ORAL | 1 refills | Status: DC

## 2021-03-02 MED ORDER — BETAMETHASONE DIPROPIONATE AUG 0.05 % EX LOTN: TOPICAL_LOTION | CUTANEOUS | 2 refills | Status: DC

## 2021-03-02 MED ORDER — JANUMET XR 100-1000 MG PO TB24: 1.0000 | ORAL_TABLET | Freq: Every day | ORAL | 12 refills | Status: DC

## 2021-03-02 NOTE — Progress Notes (Signed)
Subjective: CC: DM PCP: [name], DO SNK:NLZJQ Carla Mason is a 60 y.o. female presenting to clinic today for:  1. Type 2 Diabetes with hypertension, hyperlipidemia:  Patient reports compliance with her Janumet [phone] mg extended release daily.  She has not yet used a freestyle libre because she is self-conscious about using it on the posterior arm.  She does not want coworkers questioning her about this.  She is compliant with her Azor and her Lipitor.  Overall she seems to be feeling pretty well  Last eye exam: Has scheduled for August Last foot exam: Up-to-date Last A1c:  Lab Results  Component Value Date   HGBA1C 7.2 (H) 11/27/2020   Nephropathy screen indicated?:  On ARB Last flu, zoster and/or pneumovax:  Immunization History  Administered Date(s) Administered   Influenza-Unspecified 06/23/2017, 07/05/2020   PFIZER(Purple Top)SARS-COV-2 Vaccination 06/03/2020, 06/23/2020   Td 05/15/2011   Tdap 05/15/2011    ROS: Denies dizziness, LOC, polyuria, polydipsia, unintended weight loss/gain, foot ulcerations, numbness or tingling in extremities, shortness of breath or chest pain.    ROS: Per HPI  No Known Allergies Past Medical History:  Diagnosis Date   Diabetes mellitus without complication (HCC)    Hyperlipidemia    Hypertension    Neuropathy    Obesity    Vitamin D deficiency disease     Current Outpatient Medications:    Accu-Chek Softclix Lancets lancets, Use as instructed, Disp: 100 each, Rfl: 12   amLODipine-olmesartan (AZOR) 5-40 MG tablet, TAKE 1 TABLET BY MOUTH EVERY DAY, Disp: 90 tablet, Rfl: 0   aspirin 81 MG chewable tablet, Chew 81 mg by mouth daily., Disp: , Rfl:    atorvastatin (LIPITOR) 40 MG tablet, TAKE 1 TABLET BY MOUTH EVERY DAY, Disp: 90 tablet, Rfl: 0   betamethasone, augmented, (DIPROLENE) 0.05 % lotion, APPLY TO AFFECTED AREA TWICE A DAY, Disp: 60 mL, Rfl: 2   blood glucose meter kit and supplies, Dispense based on patient and  insurance preference (wants freestyle). Use up to one time daily as directed. (FOR ICD-10 E10.9, E11.9)., Disp: 1 each, Rfl: 0   cholecalciferol (VITAMIN D) 1000 UNITS tablet, Take 2,000 Units by mouth daily., Disp: , Rfl:    Continuous Blood Gluc Receiver (FREESTYLE LIBRE 2 READER) DEVI, 1 EACH BY DOES NOT APPLY ROUTE, Disp: 1 each, Rfl: 0   Continuous Blood Gluc Sensor (FREESTYLE LIBRE 2 SENSOR) MISC, Use as directed every 14 days. E11.9, Disp: 2 each, Rfl: 12   furosemide (LASIX) 20 MG tablet, Take 1 tablet (20 mg total) by mouth daily., Disp: 90 tablet, Rfl: 3   glucose blood (ACCU-CHEK GUIDE) test strip, USE TEST BLOOD SUGAR DAILY. DX: E11.9, Disp: 100 each, Rfl: 12   Lancet Device MISC, Use to test BG once daily, Disp: 100 each, Rfl: 3   SitaGLIPtin-MetFORMIN HCl (JANUMET XR) [phone] MG TB24, Take 1 tablet by mouth daily., Disp: 30 tablet, Rfl: 12 Social History   Socioeconomic History   Marital status: Married    Spouse name: Not on file   Number of children: Not on file   Years of education: Not on file   Highest education level: Not on file  Occupational History   Not on file  Tobacco Use   Smoking status: Never   Smokeless tobacco: Never  Vaping Use   Vaping Use: Never used  Substance and Sexual Activity   Alcohol use: Never   Drug use: Never   Sexual activity: Not on file  Other Topics  Concern   Not on file  Social History Narrative   Not on file   Social Determinants of Health   Financial Resource Strain: Not on file  Food Insecurity: Not on file  Transportation Needs: Not on file  Physical Activity: Not on file  Stress: Not on file  Social Connections: Not on file  Intimate Partner Violence: Not on file   Family History  Problem Relation Age of Onset   Heart attack Mother    Cancer Father    CVA Maternal Grandmother    Heart attack Maternal Grandfather    Cancer Sister    Early death Brother    Heart attack Sister    Healthy Sister      Objective: Office vital signs reviewed. BP 120/70   Pulse 69   Temp (!) 97.4 F (36.3 C) (Temporal)   Ht _0  (1.549 m)   Wt 240 lb (108.9 kg)   SpO2 95%   BMI 45.35 kg/m   Physical Examination:  General: Awake, alert, well nourished, No acute distress HEENT: Normal, sclera white, MMM Cardio: regular rate and rhythm, S1S2 heard, no murmurs appreciated Pulm: clear to auscultation bilaterally, no wheezes, rhonchi or rales; normal work of breathing on room air Extremities: warm, well perfused, No edema, cyanosis or clubbing; +2 pulses bilaterally MSK: normal gait and station  Assessment/ Plan: 60 y.o. female   Controlled type 2 diabetes mellitus with other specified complication, without long-term current use of insulin (HCC) - Plan: Bayer DCA Hb A1c Waived, SitaGLIPtin-MetFORMIN HCl (JANUMET XR) [phone] MG TB24  Hypertension associated with diabetes ([place]) - Plan: CMP14+EGFR, amLODipine-olmesartan (AZOR) 5-40 MG tablet  Hyperlipidemia associated with type 2 diabetes mellitus (HCC) - Plan: CMP14+EGFR, Lipid Panel, atorvastatin (LIPITOR) 40 MG tablet  Lichen planopilaris of vulva - Plan: betamethasone, augmented, (DIPROLENE) 0.05 % lotion  Peripheral edema - Plan: furosemide (LASIX) 20 MG tablet  Sugars now under control with A1c down to 6.6.  I congratulated her on this.  No changes needed.  Medications have been renewed.  Advised that off label, she could use the freestyle libre along the lateral upper thigh.  She voiced good understanding will try this.  Blood pressure under good control.  Continue current regimen  Check lipid panel, CMP.  Statin renewed  Edema not present on exam.  Continue furosemide as directed  Lichen of vulva was not discussed.  She needs renewal of medication however.  May follow-up in 4 months, sooner if needed  No orders of the defined types were placed in this encounter.  Meds ordered this encounter  Medications   atorvastatin (LIPITOR)  40 MG tablet    Sig: Take 1 tablet (40 mg total) by mouth daily.    Dispense:  90 tablet    Refill:  1   amLODipine-olmesartan (AZOR) 5-40 MG tablet    Sig: Take 1 tablet by mouth daily.    Dispense:  90 tablet    Refill:  1   betamethasone, augmented, (DIPROLENE) 0.05 % lotion    Sig: APPLY TO AFFECTED AREA TWICE A DAY    Dispense:  60 mL    Refill:  2   furosemide (LASIX) 20 MG tablet    Sig: Take 1 tablet (20 mg total) by mouth daily.    Dispense:  90 tablet    Refill:  3   SitaGLIPtin-MetFORMIN HCl (JANUMET XR) [phone] MG TB24    Sig: Take 1 tablet by mouth daily.    Dispense:  30 tablet  Refill:  [place], [place] [phone]

## 2021-03-02 NOTE — Patient Instructions (Signed)
You CAN place the [place] on the upper outter thigh if you want

## 2021-04-10 DIAGNOSIS — L02423 Furuncle of right upper limb: Secondary | ICD-10-CM | POA: Diagnosis not present

## 2021-07-03 ENCOUNTER — Ambulatory Visit: Payer: BC Managed Care – PPO | Admitting: Family Medicine

## 2021-07-03 ENCOUNTER — Other Ambulatory Visit: Payer: Self-pay

## 2021-07-03 ENCOUNTER — Encounter: Payer: Self-pay | Admitting: Family Medicine

## 2021-07-03 VITALS — BP 128/79 | HR 67 | Temp 98.6°F | Ht 61.0 in | Wt 243.4 lb

## 2021-07-03 DIAGNOSIS — L989 Disorder of the skin and subcutaneous tissue, unspecified: Secondary | ICD-10-CM

## 2021-07-03 DIAGNOSIS — E1159 Type 2 diabetes mellitus with other circulatory complications: Secondary | ICD-10-CM

## 2021-07-03 DIAGNOSIS — E1169 Type 2 diabetes mellitus with other specified complication: Secondary | ICD-10-CM | POA: Diagnosis not present

## 2021-07-03 DIAGNOSIS — M170 Bilateral primary osteoarthritis of knee: Secondary | ICD-10-CM

## 2021-07-03 DIAGNOSIS — E785 Hyperlipidemia, unspecified: Secondary | ICD-10-CM

## 2021-07-03 DIAGNOSIS — I152 Hypertension secondary to endocrine disorders: Secondary | ICD-10-CM

## 2021-07-03 DIAGNOSIS — Z1211 Encounter for screening for malignant neoplasm of colon: Secondary | ICD-10-CM

## 2021-07-03 LAB — BAYER DCA HB A1C WAIVED: HB A1C (BAYER DCA - WAIVED): 6.3 % — ABNORMAL HIGH (ref 4.8–5.6)

## 2021-07-03 MED ORDER — TRIAMCINOLONE ACETONIDE 0.1 % EX CREA: 1.0000 "application " | TOPICAL_CREAM | Freq: Two times a day (BID) | CUTANEOUS | 0 refills | Status: AC

## 2021-07-03 NOTE — Patient Instructions (Signed)
Psoriasis Psoriasis is a long-term (chronic) skin condition. It occurs because your body's defense system (immune system) causes skin cells to form too quickly. This causes raised, red patches (plaques) on your skin that look silvery. The patches may be on all areas of your body.They can be any size or shape. Psoriasis can come and go. It can range from mild to very bad. It cannot be passed from one person to another (is not contagious). There is no cure for this condition, but it can be helped with treatment. What are the causes? The cause of psoriasis is not known. Some things can make it worse. These are: Skin damage, such as cuts, scrapes, sunburn, and dryness. Not getting enough sunlight. Some medicines. Alcohol. Tobacco. Stress. Infections. What increases the risk? Having a family member with psoriasis. Being very overweight (obese). Being 20-40 years old. Taking certain medicines. What are the signs or symptoms? There are different types of psoriasis. The types are: Plaque. This is the most common. Symptoms include red, raised patches with a silvery coating. These may be itchy. Your nails may be crumbly or fall off. Guttate. Symptoms include small red spots on your stomach area, arms, and legs. These may happen after you have been sick, such as with strep throat. Inverse. Symptoms include patches in your armpits, under your breasts, private areas, or on your butt. Pustular. Symptoms include pus-filled bumps on the palms of your hands or the soles of your feet. You also may feel very tired, weak, have a fever, and not be hungry. Erythrodermic. Symptoms include bright red skin that looks burned. You may have a fast heartbeat and a body temperature that is too high or too low. You may be itchy or in pain. Sebopsoriasis. Symptoms include red patches on your scalp, forehead, and face that are greasy. Psoriatic arthritis. Symptoms include swollen, painful joints along with scaly skin  patches. How is this treated? There is no cure for this condition, but treatment can: Help your skin heal. Lessen itching and irritation and swelling (inflammation). Slow the growth of new skin cells. Help your body's defense system respond better to your skin. Treatment may include: Creams or ointments. Light therapy. This may include natural sunlight or light therapy in a doctor's office. Medicines. These can help your body better manage skin cells. They may be used with light therapy or ointments. Medicines may include pills or injections. You may also get antibiotic medicines if you have an infection. Follow these instructions at home: Skin Care Apply lotion to your skin as needed. Only use those that your doctor has said are okay. Apply cool, wet cloths (cold compresses) to the affected areas. Do not use a hot tub or take hot showers. Use slightly warm, not hot, water when taking showers and baths. Do not scratch your skin. Lifestyle  Do not use any products that contain nicotine or tobacco, such as cigarettes, e-cigarettes, and chewing tobacco. If you need help quitting, ask your doctor. Lower your stress. Keep a healthy weight. Go out in the sun as told by your doctor. Do not get sunburned. Join a support group.  Medicines Take or use over-the-counter and prescription medicines only as told by your doctor. If you were prescribed an antibiotic medicine, take it as told by your doctor. Do not stop using the antibiotic even if you start to feel better. Alcohol use If you drink alcohol: Limit how much you use: 0-1 drink a day for women. 0-2 drinks a day for men.   Be aware of how much alcohol is in your drink. In the [place] one drink equals one 12 oz bottle of beer (355 mL), one 5 oz glass of wine (148 mL), or one 1 oz glass of hard liquor (44 mL). General instructions Keep a journal to track the things that cause symptoms (triggers). Try to avoid these things. See a counselor if  you feel the support would help. Keep all follow-up visits as told by your doctor. This is important. Contact a doctor if: You have a fever. Your pain gets worse. You have more redness or warmth in the affected areas. You have new or worse pain or stiffness in your joints. Your nails start to break easily or pull away from the nail bed. You feel very sad (depressed). Summary Psoriasis is a long-term (chronic) skin condition. There is no cure for this condition, but treatment can help manage it. Keep a journal to track the things that cause symptoms. Take or use over-the-counter and prescription medicines only as told by your doctor. Keep all follow-up visits as told by your doctor. This is important. This information is not intended to replace advice given to you by your health care provider. Make sure you discuss any questions you have with your healthcare provider. Document Revised: 07/14/2018 Document Reviewed: 07/14/2018 Elsevier Patient Education  2022 [employer]  

## 2021-07-03 NOTE — Progress Notes (Signed)
Subjective: CC:DM PCP: [name], DO MVV:KPQAE Carla Mason is a 60 y.o. female presenting to clinic today for:  1. Type 2 Diabetes with hypertension, hyperlipidemia:  Reports blood sugars have been running normal.  Denies any hypo or hyperglycemic episodes.  Compliant with Janumet.  Compliant with Lipitor, Azor and Lasix.  Last eye exam: Up-to-date Last foot exam: Up-to-date Last A1c:  Lab Results  Component Value Date   HGBA1C 6.6 03/02/2021   Nephropathy screen indicated?:  On ARB Last flu, zoster and/or pneumovax:  Immunization History  Administered Date(s) Administered   Influenza-Unspecified 06/23/2017, 07/05/2020   PFIZER(Purple Top)SARS-COV-2 Vaccination 06/03/2020, 06/23/2020   Td 05/15/2011   Tdap 05/15/2011    ROS: Denies dizziness, LOC, polyuria, polydipsia, unintended weight loss/gain, foot ulcerations, numbness or tingling in extremities, shortness of breath or chest pain.  2.  Chronic knee pain Patient with ongoing chronic bilateral knee pain.  She is been seen by orthopedics many years ago in [place] and she would like to see them again.  She was told at that point that she had "bone-on-bone" and that it would be a matter of time before surgery is required.  She reports that pain seems to be progressive and she is ready to pursue this.  3.  Rash Patient reports several skin lesions that have been previously evaluated by dermatology.  These apparently did not demonstrate any cancerous findings but she had a new lesion come up along her stomach.  Not currently using any creams.  Denies any diffuse itching.  No exudates   ROS: Per HPI  No Known Allergies Past Medical History:  Diagnosis Date   Diabetes mellitus without complication ([place])    Hyperlipidemia    Hypertension    Neuropathy    Obesity    Vitamin D deficiency disease     Current Outpatient Medications:    Accu-Chek Softclix Lancets lancets, Use as instructed, Disp: 100 each, Rfl:  12   amLODipine-olmesartan (AZOR) 5-40 MG tablet, Take 1 tablet by mouth daily., Disp: 90 tablet, Rfl: 1   aspirin 81 MG chewable tablet, Chew 81 mg by mouth daily., Disp: , Rfl:    atorvastatin (LIPITOR) 40 MG tablet, Take 1 tablet (40 mg total) by mouth daily., Disp: 90 tablet, Rfl: 1   betamethasone, augmented, (DIPROLENE) 0.05 % lotion, APPLY TO AFFECTED AREA TWICE A DAY, Disp: 60 mL, Rfl: 2   blood glucose meter kit and supplies, Dispense based on patient and insurance preference (wants freestyle). Use up to one time daily as directed. (FOR ICD-10 E10.9, E11.9)., Disp: 1 each, Rfl: 0   cholecalciferol (VITAMIN D) 1000 UNITS tablet, Take 2,000 Units by mouth daily., Disp: , Rfl:    Continuous Blood Gluc Receiver (FREESTYLE LIBRE 2 READER) DEVI, 1 EACH BY DOES NOT APPLY ROUTE, Disp: 1 each, Rfl: 0   Continuous Blood Gluc Sensor (FREESTYLE LIBRE 2 SENSOR) MISC, Use as directed every 14 days. E11.9, Disp: 2 each, Rfl: 12   furosemide (LASIX) 20 MG tablet, Take 1 tablet (20 mg total) by mouth daily., Disp: 90 tablet, Rfl: 3   glucose blood (ACCU-CHEK GUIDE) test strip, USE TEST BLOOD SUGAR DAILY. DX: E11.9, Disp: 100 each, Rfl: 12   Lancet Device MISC, Use to test BG once daily, Disp: 100 each, Rfl: 3   SitaGLIPtin-MetFORMIN HCl (JANUMET XR) [phone] MG TB24, Take 1 tablet by mouth daily., Disp: 30 tablet, Rfl: 12 Social History   Socioeconomic History   Marital status: Married    Spouse  name: Not on file   Number of children: Not on file   Years of education: Not on file   Highest education level: Not on file  Occupational History   Not on file  Tobacco Use   Smoking status: Never   Smokeless tobacco: Never  Vaping Use   Vaping Use: Never used  Substance and Sexual Activity   Alcohol use: Never   Drug use: Never   Sexual activity: Not on file  Other Topics Concern   Not on file  Social History Narrative   Not on file   Social Determinants of Health   Financial Resource Strain:  Not on file  Food Insecurity: Not on file  Transportation Needs: Not on file  Physical Activity: Not on file  Stress: Not on file  Social Connections: Not on file  Intimate Partner Violence: Not on file   Family History  Problem Relation Age of Onset   Heart attack Mother    Cancer Father    CVA Maternal Grandmother    Heart attack Maternal Grandfather    Cancer Sister    Early death Brother    Heart attack Sister    Healthy Sister     Objective: Office vital signs reviewed. BP 128/79   Pulse 67   Temp 98.6 F (37 C)   Ht _0  (1.549 m)   Wt 243 lb 6.4 oz (110.4 kg)   SpO2 95%   BMI 45.99 kg/m   Physical Examination:  General: Awake, alert, well nourished, No acute distress Cardio: regular rate and rhythm, S1S2 heard, soft murmur Pulm: clear to auscultation bilaterally, no wheezes, rhonchi or rales; normal work of breathing on room air Extremities: warm, well perfused, has compression hose in place MSK: Antalgic gait.  No gross joint effusion, warmth or soft tissue swelling noted at the knees Skin: Lesions of concern appear to be plaque-like in nature.  She has several along the right anterior thigh and 1 just at the upper abdomen below the bra line   Assessment/ Plan: 60 y.o. female   Controlled type 2 diabetes mellitus with other specified complication, without long-term current use of insulin ([place]) - Plan: Bayer DCA Hb A1c Waived  Hypertension associated with diabetes ([place])  Hyperlipidemia associated with type 2 diabetes mellitus ([place])  Skin lesions - Plan: triamcinolone cream (KENALOG) 0.1 %  Colon cancer screening - Plan: Cologuard  Primary osteoarthritis of both knees - Plan: Ambulatory referral to Orthopedic Surgery  Sugar remains under excellent control.  No changes needed  Blood pressure controlled.  Continue current regimen  Continue statin.  Not yet due for fasting lipid  Skin lesions perhaps are manifestations of psoriasis?  Trial of topical  corticosteroid  Cologuard ordered for colon cancer screening.  OA previously noted to be advanced and surgery was recommended.  She notes that the pain is becoming severe enough that she is ready to pursue this.  Wants referral back to the orthopedic in [facility] that she was seen previously  Orders Placed This Encounter  Procedures   Bayer [place] Hb A1c Waived   No orders of the defined types were placed in this encounter.    [name], DO [place] [phone]

## 2021-07-12 DIAGNOSIS — M1711 Unilateral primary osteoarthritis, right knee: Secondary | ICD-10-CM | POA: Diagnosis not present

## 2021-07-12 DIAGNOSIS — M25561 Pain in right knee: Secondary | ICD-10-CM | POA: Diagnosis not present

## 2021-07-12 DIAGNOSIS — Z1211 Encounter for screening for malignant neoplasm of colon: Secondary | ICD-10-CM | POA: Diagnosis not present

## 2021-07-12 DIAGNOSIS — M1712 Unilateral primary osteoarthritis, left knee: Secondary | ICD-10-CM | POA: Diagnosis not present

## 2021-07-12 DIAGNOSIS — M25562 Pain in left knee: Secondary | ICD-10-CM | POA: Diagnosis not present

## 2021-07-12 DIAGNOSIS — M170 Bilateral primary osteoarthritis of knee: Secondary | ICD-10-CM | POA: Diagnosis not present

## 2021-07-19 LAB — COLOGUARD: COLOGUARD: NEGATIVE

## 2021-07-31 DIAGNOSIS — Z1231 Encounter for screening mammogram for malignant neoplasm of breast: Secondary | ICD-10-CM | POA: Diagnosis not present

## 2021-10-04 DIAGNOSIS — R21 Rash and other nonspecific skin eruption: Secondary | ICD-10-CM | POA: Diagnosis not present

## 2021-10-04 DIAGNOSIS — L308 Other specified dermatitis: Secondary | ICD-10-CM | POA: Diagnosis not present

## 2021-10-04 DIAGNOSIS — N904 Leukoplakia of vulva: Secondary | ICD-10-CM | POA: Diagnosis not present

## 2021-10-04 DIAGNOSIS — L988 Other specified disorders of the skin and subcutaneous tissue: Secondary | ICD-10-CM | POA: Diagnosis not present

## 2021-10-10 ENCOUNTER — Other Ambulatory Visit: Payer: Self-pay | Admitting: Family Medicine

## 2021-10-10 DIAGNOSIS — I152 Hypertension secondary to endocrine disorders: Secondary | ICD-10-CM

## 2021-10-11 ENCOUNTER — Ambulatory Visit: Payer: BC Managed Care – PPO | Admitting: Family Medicine

## 2021-10-11 ENCOUNTER — Encounter: Payer: Self-pay | Admitting: Family Medicine

## 2021-10-11 DIAGNOSIS — R051 Acute cough: Secondary | ICD-10-CM | POA: Diagnosis not present

## 2021-10-11 DIAGNOSIS — U071 COVID-19: Secondary | ICD-10-CM

## 2021-10-11 DIAGNOSIS — J3489 Other specified disorders of nose and nasal sinuses: Secondary | ICD-10-CM

## 2021-10-11 MED ORDER — MOLNUPIRAVIR EUA 200MG CAPSULE: 4.0000 | ORAL_CAPSULE | Freq: Two times a day (BID) | ORAL | 0 refills | Status: AC

## 2021-10-11 NOTE — Progress Notes (Signed)
Virtual Visit via telephone Note  I connected with Carla Mason on 10/11/21 at 1549 by telephone and verified that I am speaking with the correct person using two identifiers. Carla Mason is currently located at home and patient are currently with her during visit. The provider, Fransisca Kaufmann Brailyn Killion, MD is located in their office at time of visit.  Call ended at 1556  I discussed the limitations, risks, security and privacy concerns of performing an evaluation and management service by telephone and the availability of in person appointments. I also discussed with the patient that there may be a patient responsible charge related to this service. The patient expressed understanding and agreed to proceed.   History and Present Illness: Patient started having congestion and cough and chills that started yesterday.  She has some soreness in her throat and teeth.  She denies sick contacts.  She has been using coricidin and zyrtec and they are helping. She tested a home test that was positive for covid.  She denies SOB wheezing or cough.  She has the 1st 2 covid vaccines but no boosters.   Outpatient Encounter Medications as of 10/11/2021  Medication Sig   molnupiravir EUA (LAGEVRIO) 200 mg CAPS capsule Take 4 capsules (800 mg total) by mouth 2 (two) times daily for 5 days.   Accu-Chek Softclix Lancets lancets Use as instructed   amLODipine-olmesartan (AZOR) 5-40 MG tablet TAKE 1 TABLET BY MOUTH EVERY DAY   aspirin 81 MG chewable tablet Chew 81 mg by mouth daily.   atorvastatin (LIPITOR) 40 MG tablet Take 1 tablet (40 mg total) by mouth daily.   betamethasone, augmented, (DIPROLENE) 0.05 % lotion APPLY TO AFFECTED AREA TWICE A DAY   blood glucose meter kit and supplies Dispense based on patient and insurance preference (wants freestyle). Use up to one time daily as directed. (FOR ICD-10 E10.9, E11.9).   cholecalciferol (VITAMIN D) 1000 UNITS tablet Take 2,000 Units by mouth daily.   Continuous  Blood Gluc Receiver (FREESTYLE LIBRE 2 READER) DEVI 1 EACH BY DOES NOT APPLY ROUTE   Continuous Blood Gluc Sensor (FREESTYLE LIBRE 2 SENSOR) MISC Use as directed every 14 days. E11.9   furosemide (LASIX) 20 MG tablet Take 1 tablet (20 mg total) by mouth daily.   glucose blood (ACCU-CHEK GUIDE) test strip USE TEST BLOOD SUGAR DAILY. DX: E11.9   Lancet Device MISC Use to test BG once daily   SitaGLIPtin-MetFORMIN HCl (JANUMET XR) [phone] MG TB24 Take 1 tablet by mouth daily.   triamcinolone cream (KENALOG) 0.1 % Apply 1 application topically 2 (two) times daily. X10 days   No facility-administered encounter medications on file as of 10/11/2021.    Review of Systems  Constitutional:  Positive for chills. Negative for fever.  HENT:  Positive for congestion, postnasal drip, rhinorrhea and sore throat. Negative for ear discharge, ear pain, sinus pressure and sneezing.   Eyes:  Negative for pain, redness and visual disturbance.  Respiratory:  Negative for cough, chest tightness, shortness of breath and wheezing.   Cardiovascular:  Negative for chest pain and leg swelling.  Genitourinary:  Negative for difficulty urinating and dysuria.  Musculoskeletal:  Negative for back pain, gait problem and myalgias.  Skin:  Negative for rash.  Neurological:  Negative for light-headedness and headaches.  Psychiatric/Behavioral:  Negative for agitation and behavioral problems.   All other systems reviewed and are negative.  Observations/Objective: Patient sounds comfortable and in no acute distress  Assessment and Plan: Problem List Items  Addressed This Visit   None Visit Diagnoses     Positive self-administered antigen test for COVID-19    -  Primary   Relevant Medications   molnupiravir EUA (LAGEVRIO) 200 mg CAPS capsule   Acute cough       Relevant Medications   molnupiravir EUA (LAGEVRIO) 200 mg CAPS capsule   Other Relevant Orders   Novel Coronavirus, NAA ([facility])   Nasal drainage        Relevant Medications   molnupiravir EUA (LAGEVRIO) 200 mg CAPS capsule   Other Relevant Orders   Novel Coronavirus, NAA ([facility])       Positive for COVID at home and will do testing for sure.  Here but recommended for patient to quarantine for at least the 5 days and send in the antiviral medicine because she is higher risk. Follow up plan: Return if symptoms worsen or fail to improve.     I discussed the assessment and treatment plan with the patient. The patient was provided an opportunity to ask questions and all were answered. The patient agreed with the plan and demonstrated an understanding of the instructions.   The patient was advised to call back or seek an in-person evaluation if the symptoms worsen or if the condition fails to improve as anticipated.  The above assessment and management plan was discussed with the patient. The patient verbalized understanding of and has agreed to the management plan. Patient is aware to call the clinic if symptoms persist or worsen. Patient is aware when to return to the clinic for a follow-up visit. Patient educated on when it is appropriate to go to the emergency department.    I provided 7 minutes of non-face-to-face time during this encounter.    Worthy Rancher, MD

## 2021-10-12 ENCOUNTER — Telehealth: Payer: Self-pay | Admitting: Family Medicine

## 2021-10-12 LAB — SARS-COV-2, NAA 2 DAY TAT

## 2021-10-12 LAB — NOVEL CORONAVIRUS, NAA: SARS-CoV-2, NAA: DETECTED — AB

## 2021-10-12 NOTE — Telephone Encounter (Signed)
Patient did come back positive, she needs a copy of that then we can send it to her work or fax it to her work.  Let us know if any symptoms worsen

## 2021-10-15 ENCOUNTER — Telehealth: Payer: Self-pay | Admitting: Family Medicine

## 2021-10-15 NOTE — Telephone Encounter (Signed)
I have tried x3 to send fax to pts employer, [employer]. Pt provided [phone]. Fax is failing every time.  Left message for pt to return call.

## 2021-10-15 NOTE — Telephone Encounter (Signed)
Pt is aware of results. Left message for her to return call. Fax number pt gave for employer is not going through. See prior telephone encounter.

## 2021-10-23 NOTE — Telephone Encounter (Signed)
Attempts to contact pt without return call in over 3 days, will close encounter. 

## 2021-11-06 ENCOUNTER — Encounter: Payer: Self-pay | Admitting: Family Medicine

## 2021-11-06 ENCOUNTER — Other Ambulatory Visit: Payer: Self-pay | Admitting: Family Medicine

## 2021-11-06 ENCOUNTER — Ambulatory Visit: Payer: BC Managed Care – PPO | Admitting: Family Medicine

## 2021-11-06 VITALS — BP 124/67 | HR 75 | Temp 97.9°F | Ht 61.0 in | Wt 249.0 lb

## 2021-11-06 DIAGNOSIS — E1169 Type 2 diabetes mellitus with other specified complication: Secondary | ICD-10-CM | POA: Diagnosis not present

## 2021-11-06 DIAGNOSIS — I152 Hypertension secondary to endocrine disorders: Secondary | ICD-10-CM

## 2021-11-06 DIAGNOSIS — L900 Lichen sclerosus et atrophicus: Secondary | ICD-10-CM

## 2021-11-06 DIAGNOSIS — E1159 Type 2 diabetes mellitus with other circulatory complications: Secondary | ICD-10-CM | POA: Diagnosis not present

## 2021-11-06 DIAGNOSIS — E785 Hyperlipidemia, unspecified: Secondary | ICD-10-CM

## 2021-11-06 DIAGNOSIS — Z5181 Encounter for therapeutic drug level monitoring: Secondary | ICD-10-CM

## 2021-11-06 LAB — BAYER DCA HB A1C WAIVED: HB A1C (BAYER DCA - WAIVED): 6.4 % — ABNORMAL HIGH (ref 4.8–5.6)

## 2021-11-06 NOTE — Progress Notes (Signed)
Subjective: CC:DM PCP: [name], DO DXA:JOINO YOANA STAIB is a 61 y.o. female presenting to clinic today for:  1. Type 2 Diabetes with hypertension, hyperlipidemia:  She is compliant with her medications.  She denies any dizziness, chest pain, shortness of breath.  She has been having issues with lesions of her skin and was seen by dermatology recently who put her on methotrexate, folic acid and topical clobetasol cream.  She has an appointment with a specialist that specifically treats these lesions in the GYN area and that appointment is scheduled in April.  She has not been applying any of the clobetasol to this area because she wanted the opinion of the specialist before she started that med.  Last eye exam: UTD Last foot exam: UTD Last A1c:  Lab Results  Component Value Date   HGBA1C 6.3 (H) 07/03/2021   Nephropathy screen indicated?: n/a Last flu, zoster and/or pneumovax:  Immunization History  Administered Date(s) Administered   Influenza-Unspecified 06/23/2017, 07/05/2020   PFIZER(Purple Top)SARS-COV-2 Vaccination 06/03/2020, 06/23/2020   Td 05/15/2011   Td (Adult) 05/15/2011   Tdap 05/15/2011   ROS: Per HPI  No Known Allergies Past Medical History:  Diagnosis Date   Diabetes mellitus without complication ([place])    Hyperlipidemia    Hypertension    Neuropathy    Obesity    Vitamin D deficiency disease     Current Outpatient Medications:    Accu-Chek Softclix Lancets lancets, Use as instructed, Disp: 100 each, Rfl: 12   amLODipine-olmesartan (AZOR) 5-40 MG tablet, TAKE 1 TABLET BY MOUTH EVERY DAY, Disp: 90 tablet, Rfl: 0   aspirin 81 MG chewable tablet, Chew 81 mg by mouth daily., Disp: , Rfl:    atorvastatin (LIPITOR) 40 MG tablet, TAKE 1 TABLET BY MOUTH EVERY DAY, Disp: 90 tablet, Rfl: 0   betamethasone, augmented, (DIPROLENE) 0.05 % lotion, APPLY TO AFFECTED AREA TWICE A DAY, Disp: 60 mL, Rfl: 2   blood glucose meter kit and supplies, Dispense based on  patient and insurance preference (wants freestyle). Use up to one time daily as directed. (FOR ICD-10 E10.9, E11.9)., Disp: 1 each, Rfl: 0   cholecalciferol (VITAMIN D) 1000 UNITS tablet, Take 2,000 Units by mouth daily., Disp: , Rfl:    clobetasol ointment (TEMOVATE) 0.05 %, Apply topically daily., Disp: , Rfl:    Continuous Blood Gluc Receiver (FREESTYLE LIBRE 2 READER) DEVI, 1 EACH BY DOES NOT APPLY ROUTE, Disp: 1 each, Rfl: 0   Continuous Blood Gluc Sensor (FREESTYLE LIBRE 2 SENSOR) MISC, Use as directed every 14 days. E11.9, Disp: 2 each, Rfl: 12   folic acid (FOLVITE) 1 MG tablet, Take 1 mg by mouth daily., Disp: , Rfl:    furosemide (LASIX) 20 MG tablet, Take 1 tablet (20 mg total) by mouth daily., Disp: 90 tablet, Rfl: 3   glucose blood (ACCU-CHEK GUIDE) test strip, USE TEST BLOOD SUGAR DAILY. DX: E11.9, Disp: 100 each, Rfl: 12   Lancet Device MISC, Use to test BG once daily, Disp: 100 each, Rfl: 3   methotrexate (RHEUMATREX) 2.5 MG tablet, SMARTSIG:3 Tablet(s) By Mouth Once a Week, Disp: , Rfl:    SitaGLIPtin-MetFORMIN HCl (JANUMET XR) [phone] MG TB24, Take 1 tablet by mouth daily., Disp: 30 tablet, Rfl: 12   triamcinolone cream (KENALOG) 0.1 %, Apply 1 application topically 2 (two) times daily. X10 days, Disp: 60 g, Rfl: 0 Social History   Socioeconomic History   Marital status: Married    Spouse name: Not on file  Number of children: Not on file   Years of education: Not on file   Highest education level: Not on file  Occupational History   Not on file  Tobacco Use   Smoking status: Never   Smokeless tobacco: Never  Vaping Use   Vaping Use: Never used  Substance and Sexual Activity   Alcohol use: Never   Drug use: Never   Sexual activity: Not on file  Other Topics Concern   Not on file  Social History Narrative   Not on file   Social Determinants of Health   Financial Resource Strain: Not on file  Food Insecurity: Not on file  Transportation Needs: Not on file   Physical Activity: Not on file  Stress: Not on file  Social Connections: Not on file  Intimate Partner Violence: Not on file   Family History  Problem Relation Age of Onset   Heart attack Mother    Cancer Father    CVA Maternal Grandmother    Heart attack Maternal Grandfather    Cancer Sister    Early death Brother    Heart attack Sister    Healthy Sister     Objective: Office vital signs reviewed. BP 124/67    Pulse 75    Temp 97.9 F (36.6 C)    Ht '5\' 1"'  (1.549 m)    Wt 249 lb (112.9 kg)    SpO2 95%    BMI 47.05 kg/m   Physical Examination:  General: Awake, alert, well nourished, No acute distress HEENT: sclera white, MMM Cardio: regular rate and rhythm, S1S2 heard, no murmurs appreciated Pulm: clear to auscultation bilaterally, no wheezes, rhonchi or rales; normal work of breathing on room air Extremities: warm, well perfused, No edema, cyanosis or clubbing; +2 pulses bilaterally MSK: normal gait Skin: Plaque-like lesions noted along the inner thighs bilaterally and along the upper abdomen  Assessment/ Plan: 61 y.o. female   Controlled type 2 diabetes mellitus with other specified complication, without long-term current use of insulin ([place]) - Plan: Bayer DCA Hb A1c Waived  Hypertension associated with diabetes ([place])  Hyperlipidemia associated with type 2 diabetes mellitus ([facility])  Lichen sclerosus - Plan: CMP14+EGFR  Encounter for medication monitoring - Plan: CMP14+EGFR  Diabetes remains under excellent control with A1c of 6.4 today.  No changes.  Blood pressure well controlled.  Continue current regimen  Not yet due for fasting lipid.  Continue statin  Lichen sclerosus currently being treated with methotrexate, folic acid and topical corticosteroid.  Liver function test and renal function ordered.  She will come in in the next few weeks to have this done and we will CC to Dr. Paticia Stack.  May follow-up in 6 months at which point we will plan for full  physical/fasting labs  No orders of the defined types were placed in this encounter.  No orders of the defined types were placed in this encounter.    [name], DO [place] [phone]

## 2021-12-06 ENCOUNTER — Other Ambulatory Visit: Payer: BC Managed Care – PPO

## 2021-12-06 DIAGNOSIS — Z5181 Encounter for therapeutic drug level monitoring: Secondary | ICD-10-CM | POA: Diagnosis not present

## 2021-12-06 DIAGNOSIS — L900 Lichen sclerosus et atrophicus: Secondary | ICD-10-CM | POA: Diagnosis not present

## 2021-12-07 LAB — CMP14+EGFR
ALT: 22 IU/L (ref 0–32)
AST: 17 IU/L (ref 0–40)
Albumin/Globulin Ratio: 2.3 — ABNORMAL HIGH (ref 1.2–2.2)
Albumin: 4.5 g/dL (ref 3.8–4.8)
Alkaline Phosphatase: 94 IU/L (ref 44–121)
BUN/Creatinine Ratio: 13 (ref 12–28)
BUN: 8 mg/dL (ref 8–27)
Bilirubin Total: 0.5 mg/dL (ref 0.0–1.2)
CO2: 24 mmol/L (ref 20–29)
Calcium: 9.5 mg/dL (ref 8.7–10.3)
Chloride: 103 mmol/L (ref 96–106)
Creatinine, Ser: 0.61 mg/dL (ref 0.57–1.00)
Globulin, Total: 2 g/dL (ref 1.5–4.5)
Glucose: 120 mg/dL — ABNORMAL HIGH (ref 70–99)
Potassium: 4.2 mmol/L (ref 3.5–5.2)
Sodium: 143 mmol/L (ref 134–144)
Total Protein: 6.5 g/dL (ref 6.0–8.5)
eGFR: 101 mL/min/{1.73_m2} (ref >59)

## 2022-01-04 DIAGNOSIS — L900 Lichen sclerosus et atrophicus: Secondary | ICD-10-CM | POA: Diagnosis not present

## 2022-01-04 DIAGNOSIS — Z5181 Encounter for therapeutic drug level monitoring: Secondary | ICD-10-CM | POA: Diagnosis not present

## 2022-01-05 ENCOUNTER — Other Ambulatory Visit: Payer: Self-pay | Admitting: Family Medicine

## 2022-01-05 DIAGNOSIS — I152 Hypertension secondary to endocrine disorders: Secondary | ICD-10-CM

## 2022-01-31 ENCOUNTER — Other Ambulatory Visit: Payer: Self-pay | Admitting: Family Medicine

## 2022-01-31 DIAGNOSIS — E1169 Type 2 diabetes mellitus with other specified complication: Secondary | ICD-10-CM

## 2022-02-22 ENCOUNTER — Encounter: Payer: Self-pay | Admitting: Nurse Practitioner

## 2022-02-22 ENCOUNTER — Ambulatory Visit: Payer: BC Managed Care – PPO | Admitting: Nurse Practitioner

## 2022-02-22 DIAGNOSIS — J069 Acute upper respiratory infection, unspecified: Secondary | ICD-10-CM

## 2022-02-22 MED ORDER — BENZONATATE 100 MG PO CAPS: 100.0000 mg | ORAL_CAPSULE | Freq: Three times a day (TID) | ORAL | 0 refills | Status: DC | PRN

## 2022-02-22 MED ORDER — FLUTICASONE PROPIONATE 50 MCG/ACT NA SUSP: 2.0000 | Freq: Every day | NASAL | 6 refills | Status: DC

## 2022-02-22 MED ORDER — GUAIFENESIN ER 600 MG PO TB12: 600.0000 mg | ORAL_TABLET | Freq: Two times a day (BID) | ORAL | 0 refills | Status: DC

## 2022-02-22 MED ORDER — AZITHROMYCIN 250 MG PO TABS: ORAL_TABLET | ORAL | 0 refills | Status: AC

## 2022-02-22 NOTE — Progress Notes (Signed)
   Virtual Visit  Note Due to COVID-19 pandemic this visit was conducted virtually. This visit type was conducted due to national recommendations for restrictions regarding the COVID-19 Pandemic (e.g. social distancing, sheltering in place) in an effort to limit this patient's exposure and mitigate transmission in our community. All issues noted in this document were discussed and addressed.  A physical exam was not performed with this format.  I connected with Carla Mason on 02/22/22 at 4:45 pm  by telephone and verified that I am speaking with the correct person using two identifiers. Carla Mason is currently located at home during visit. The provider, Daryll Drown, NP is located in their office at time of visit.  I discussed the limitations, risks, security and privacy concerns of performing an evaluation and management service by telephone and the availability of in person appointments. I also discussed with the patient that there may be a patient responsible charge related to this service. The patient expressed understanding and agreed to proceed.   History and Present Illness:  Cough This is a new problem. The current episode started in the past 7 days. The problem has been gradually worsening. The problem occurs constantly. The cough is Productive of sputum. Associated symptoms include chills and nasal congestion. Pertinent negatives include no fever, rash or sore throat. Nothing aggravates the symptoms. She has tried nothing for the symptoms.  URI  This is a new problem. The problem has been unchanged. There has been no fever. Associated symptoms include congestion and coughing. Pertinent negatives include no nausea, rash or sore throat. She has tried nothing for the symptoms.     Review of Systems  Constitutional:  Positive for chills. Negative for fever.  HENT:  Positive for congestion. Negative for sore throat.   Respiratory:  Positive for cough.   Cardiovascular: Negative.    Gastrointestinal:  Negative for nausea.  Skin: Negative.  Negative for rash.  All other systems reviewed and are negative.   Observations/Objective: Televisit patient not in distress  Assessment and Plan: Take meds as prescribed - Use a cool mist humidifier  -Use saline nose sprays frequently -Force fluids -For fever or aches or pains- take Tylenol or ibuprofen. -Azithromycin 500 mg tablet day 1, 250 mg day 2-5 -Tessalon Perles for cough -Guaifenesin for cough and congestion -If symptoms do not improve, she may need to be COVID tested to rule this out Follow up with worsening unresolved symptoms   Follow Up Instructions: Follow up with worsening unresolved  symptoms    I discussed the assessment and treatment plan with the patient. The patient was provided an opportunity to ask questions and all were answered. The patient agreed with the plan and demonstrated an understanding of the instructions.   The patient was advised to call back or seek an in-person evaluation if the symptoms worsen or if the condition fails to improve as anticipated.  The above assessment and management plan was discussed with the patient. The patient verbalized understanding of and has agreed to the management plan. Patient is aware to call the clinic if symptoms persist or worsen. Patient is aware when to return to the clinic for a follow-up visit. Patient educated on when it is appropriate to go to the emergency department.   Time call ended: 4:56 PM  I provided 11 minutes of  non face-to-face time during this encounter.    Daryll Drown, NP

## 2022-03-14 ENCOUNTER — Ambulatory Visit: Payer: BC Managed Care – PPO | Admitting: Physician Assistant

## 2022-03-14 ENCOUNTER — Encounter: Payer: Self-pay | Admitting: Physician Assistant

## 2022-03-14 VITALS — BP 165/77 | HR 80 | Temp 97.4°F | Ht 61.0 in | Wt 250.4 lb

## 2022-03-14 DIAGNOSIS — R0781 Pleurodynia: Secondary | ICD-10-CM | POA: Diagnosis not present

## 2022-03-14 NOTE — Patient Instructions (Signed)
Cholecystitis Cholecystitis is irritation and swelling (inflammation) of the gallbladder. The gallbladder: Is an organ that is shaped like a pear. Is under the liver on the right side of the body. Stores bile. Bile helps the body break down (digest) the fats in food. This condition can occur all of a sudden. It needs to be treated. What are the causes? This condition may be caused by stones or lumps that form in the gallbladder (gallstones). Gallstones can block the tube (duct) that carries bile out of your gallbladder. Other causes include: Damage to the gallbladder due to less blood flow. Germs in the bile ducts. Scars, kinks, or adhesions in the bile ducts. Abnormal growths (tumors) in the liver, pancreas, or gallbladder. What increases the risk? You are more likely to develop this condition if: You are female and between the ages of 60-62. You take birth control pills. You use estrogen. You take certain medicines that make you more likely to develop gallstones. You are overweight (obese). You have a very bad reaction to an infection (sepsis). You have been hospitalized due to a serious condition, such as a burn or illness. You have not eaten or drank for a long time. What are the signs or symptoms? Symptoms of this condition include: Pain in the upper right part of the belly (abdomen). A lump over the gallbladder. Bloating in the belly. Feeling sick to your stomach (nauseous). Vomiting. Fever. Chills. How is this treated? This condition may be treated with: Medicines to treat pain. Giving fluids through an IV tube. Not eating or drinking (fasting). Antibiotic medicines. Surgery to take out your gallbladder. Gallbladder drainage. Follow these instructions at home: Medicines  Take over-the-counter and prescription medicines only as told by your doctor. If you were prescribed an antibiotic medicine, take it as told by your doctor. Do not stop taking it even if you start  to feel better. General instructions Follow instructions from your doctor about what to eat or drink. Do not eat or drink anything that makes you sick again. Do not smoke or use any products that contain nicotine or tobacco. If you need help quitting, ask your doctor. Keep all follow-up visits. Contact a doctor if: You have pain and your medicine does not help. You have a fever. Get help right away if: Your pain moves to: Another part of your belly. Your back. Your symptoms do not go away. You have new symptoms. These symptoms may be an emergency. Get help right away. Call 911. Do not wait to see if the symptoms will go away. Do not drive yourself to the hospital. Summary This condition may be caused by stones or lumps that form in the gallbladder (gallstones). A common symptom is pain in your belly. This condition may be treated with surgery to take out your gallbladder. Follow instructions from your doctor about what to eat or drink. This information is not intended to replace advice given to you by your health care provider. Make sure you discuss any questions you have with your health care provider. Document Revised: 03/13/2021 Document Reviewed: 03/13/2021 Elsevier Patient Education  2023 Else[employer] Inc. Chest Wall Pain Chest wall pain is pain in or around the bones and muscles of your chest. Sometimes, an injury causes this pain. Excessive coughing or overuse of arm and chest muscles may also cause chest wall pain. Sometimes, the cause may not be known. This pain may take several weeks or longer to get better. Follow these instructions at home: Managing pain, stiffness, and  swelling  If directed, put ice on the painful area: Put ice in a plastic bag. Place a towel between your skin and the bag. Leave the ice on for 20 minutes, 2-3 times per day. Activity Rest as told by your health care provider. Avoid activities that cause pain. These include any activities that use your  chest muscles or your abdominal and side muscles to lift heavy items. Ask your health care provider what activities are safe for you. General instructions  Take over-the-counter and prescription medicines only as told by your health care provider. Do not use any products that contain nicotine or tobacco, such as cigarettes, e-cigarettes, and chewing tobacco. These can delay healing after injury. If you need help quitting, ask your health care provider. Keep all follow-up visits as told by your health care provider. This is important. Contact a health care provider if: You have a fever. Your chest pain becomes worse. You have new symptoms. Get help right away if: You have nausea or vomiting. You feel sweaty or light-headed. You have a cough with mucus from your lungs (sputum) or you cough up blood. You develop shortness of breath. These symptoms may represent a serious problem that is an emergency. Do not wait to see if the symptoms will go away. Get medical help right away. Call your local emergency services (911 in the [place] Do not drive yourself to the hospital. Summary Chest wall pain is pain in or around the bones and muscles of your chest. Depending on the cause, it may be treated with ice, rest, medicines, and avoiding activities that cause pain. Contact a health care provider if you have a fever, worsening chest pain, or new symptoms. Get help right away if you feel light-headed or you develop shortness of breath. These symptoms may be an emergency. This information is not intended to replace advice given to you by your health care provider. Make sure you discuss any questions you have with your health care provider. Document Revised: 11/24/2020 Document Reviewed: 11/24/2020 Elsevier Patient Education  2023 [employer].

## 2022-03-14 NOTE — Progress Notes (Signed)
  Subjective:     Patient ID: Carla Mason, female   DOB: 11-Feb-1961, 61 y.o.   MRN: [id number]  HPI R rib pain for 3 days States sx have improved since making appt Recent cough and congestion but those sx improved before pain started She denies any rash to the area Also she had fried meal prior to sx OTC Tylenol for sx  Review of Systems  Constitutional: Negative.   HENT: Negative.    Respiratory:  Negative for cough, shortness of breath and wheezing.   Cardiovascular: Negative.        Objective:   Physical Exam Constitutional:      General: She is not in acute distress.    Appearance: Normal appearance. She is ill-appearing. She is not toxic-appearing.  Cardiovascular:     Rate and Rhythm: Normal rate and regular rhythm.     Heart sounds: Normal heart sounds.  Pulmonary:     Effort: Pulmonary effort is normal.     Breath sounds: Normal breath sounds.  Neurological:     Mental Status: She is alert.   No real TTP of the entire R rib area FROM torso today w/o sx     Assessment:     1. Rib pain on right side        Plan:     Reviewed sx may be due to recent URI Reviewed nl course OTC meds for sx Also recommended deep breathing exercises several time per day Discussed sx also may be related to the gallbladder since occurred after fatty meal Pt relates + FH of gallbladder dz in sister Again reviewed nl sx If sx reoccur f/u for eval PATP

## 2022-03-25 ENCOUNTER — Other Ambulatory Visit: Payer: Self-pay | Admitting: Family Medicine

## 2022-03-25 DIAGNOSIS — E1169 Type 2 diabetes mellitus with other specified complication: Secondary | ICD-10-CM

## 2022-03-28 DIAGNOSIS — Z5181 Encounter for therapeutic drug level monitoring: Secondary | ICD-10-CM | POA: Diagnosis not present

## 2022-03-30 ENCOUNTER — Other Ambulatory Visit: Payer: Self-pay | Admitting: Family Medicine

## 2022-03-30 DIAGNOSIS — R609 Edema, unspecified: Secondary | ICD-10-CM

## 2022-04-05 DIAGNOSIS — Z5181 Encounter for therapeutic drug level monitoring: Secondary | ICD-10-CM | POA: Diagnosis not present

## 2022-04-05 DIAGNOSIS — B353 Tinea pedis: Secondary | ICD-10-CM | POA: Diagnosis not present

## 2022-04-05 DIAGNOSIS — L900 Lichen sclerosus et atrophicus: Secondary | ICD-10-CM | POA: Diagnosis not present

## 2022-04-22 ENCOUNTER — Other Ambulatory Visit: Payer: Self-pay | Admitting: Family Medicine

## 2022-04-22 DIAGNOSIS — I152 Hypertension secondary to endocrine disorders: Secondary | ICD-10-CM

## 2022-04-22 DIAGNOSIS — E1169 Type 2 diabetes mellitus with other specified complication: Secondary | ICD-10-CM

## 2022-04-22 DIAGNOSIS — L661 Lichen planopilaris: Secondary | ICD-10-CM

## 2022-05-06 ENCOUNTER — Ambulatory Visit: Payer: BC Managed Care – PPO | Admitting: Family Medicine

## 2022-05-06 ENCOUNTER — Ambulatory Visit (INDEPENDENT_AMBULATORY_CARE_PROVIDER_SITE_OTHER): Payer: BC Managed Care – PPO

## 2022-05-06 ENCOUNTER — Encounter: Payer: Self-pay | Admitting: Family Medicine

## 2022-05-06 VITALS — BP 132/73 | HR 68 | Temp 98.2°F | Ht 61.0 in | Wt 245.2 lb

## 2022-05-06 DIAGNOSIS — I152 Hypertension secondary to endocrine disorders: Secondary | ICD-10-CM

## 2022-05-06 DIAGNOSIS — E1142 Type 2 diabetes mellitus with diabetic polyneuropathy: Secondary | ICD-10-CM

## 2022-05-06 DIAGNOSIS — Z780 Asymptomatic menopausal state: Secondary | ICD-10-CM | POA: Diagnosis not present

## 2022-05-06 DIAGNOSIS — M810 Age-related osteoporosis without current pathological fracture: Secondary | ICD-10-CM | POA: Diagnosis not present

## 2022-05-06 DIAGNOSIS — E1169 Type 2 diabetes mellitus with other specified complication: Secondary | ICD-10-CM

## 2022-05-06 DIAGNOSIS — I358 Other nonrheumatic aortic valve disorders: Secondary | ICD-10-CM

## 2022-05-06 DIAGNOSIS — E1159 Type 2 diabetes mellitus with other circulatory complications: Secondary | ICD-10-CM | POA: Diagnosis not present

## 2022-05-06 DIAGNOSIS — E785 Hyperlipidemia, unspecified: Secondary | ICD-10-CM | POA: Diagnosis not present

## 2022-05-06 DIAGNOSIS — M170 Bilateral primary osteoarthritis of knee: Secondary | ICD-10-CM

## 2022-05-06 DIAGNOSIS — M8589 Other specified disorders of bone density and structure, multiple sites: Secondary | ICD-10-CM | POA: Diagnosis not present

## 2022-05-06 LAB — BAYER DCA HB A1C WAIVED: HB A1C (BAYER DCA - WAIVED): 6.6 % — ABNORMAL HIGH (ref 4.8–5.6)

## 2022-05-06 NOTE — Progress Notes (Signed)
Subjective: CC:DM PCP: [name], DO JHE:RDEYC Carla Mason is a 61 y.o. female presenting to clinic today for:  1. Type 2 Diabetes with hypertension, hyperlipidemia and neuropathy associated with morbid obesity:  Not currently using the freestyle libre to monitor blood sugars.  Needs assistance with utilizing the device.  Compliant with medications.  No reports of symptomatic lows.  She notes really the only thing that is bothering her as of late is her knees.  She was offered replacement last October but wished to hold off on this as there was some type of less invasive procedure that was being studied.  She sees somebody in [place].  She is chronically treated with methotrexate for skin lesions.  These lesions have been better since being on that medication.  Last eye exam: UTD Last foot exam: needs Last A1c:  Lab Results  Component Value Date   HGBA1C 6.4 (H) 11/06/2021   Nephropathy screen indicated?: UTD Last flu, zoster and/or pneumovax:  Immunization History  Administered Date(s) Administered   Influenza-Unspecified 06/23/2017, 07/05/2020   PFIZER(Purple Top)SARS-COV-2 Vaccination 06/03/2020, 06/23/2020   Td 05/15/2011   Td (Adult) 05/15/2011   Tdap 05/15/2011    ROS: No chest pain, shortness of breath, visual disturbance or foot lesions reported   ROS: Per HPI  No Known Allergies Past Medical History:  Diagnosis Date   Diabetes mellitus without complication ([place])    Hyperlipidemia    Hypertension    Neuropathy    Obesity    Vitamin D deficiency disease     Current Outpatient Medications:    Accu-Chek Softclix Lancets lancets, Use as instructed, Disp: 100 each, Rfl: 12   amLODipine-olmesartan (AZOR) 5-40 MG tablet, TAKE 1 TABLET BY MOUTH EVERY DAY, Disp: 90 tablet, Rfl: 0   aspirin 81 MG chewable tablet, Chew 81 mg by mouth daily., Disp: , Rfl:    atorvastatin (LIPITOR) 40 MG tablet, TAKE 1 TABLET BY MOUTH EVERY DAY, Disp: 90 tablet, Rfl: 0    betamethasone, augmented, (DIPROLENE) 0.05 % lotion, APPLY TO AFFECTED AREA TWICE A DAY, Disp: 60 mL, Rfl: 2   blood glucose meter kit and supplies, Dispense based on patient and insurance preference (wants freestyle). Use up to one time daily as directed. (FOR ICD-10 E10.9, E11.9)., Disp: 1 each, Rfl: 0   cholecalciferol (VITAMIN D) 1000 UNITS tablet, Take 2,000 Units by mouth daily., Disp: , Rfl:    clobetasol ointment (TEMOVATE) 0.05 %, Apply topically daily., Disp: , Rfl:    Continuous Blood Gluc Receiver (FREESTYLE LIBRE 2 READER) DEVI, 1 EACH BY DOES NOT APPLY ROUTE, Disp: 1 each, Rfl: 0   Continuous Blood Gluc Sensor (FREESTYLE LIBRE 2 SENSOR) MISC, Use as directed every 14 days. E11.9, Disp: 2 each, Rfl: 12   fluticasone (FLONASE) 50 MCG/ACT nasal spray, Place 2 sprays into both nostrils daily., Disp: 16 g, Rfl: 6   folic acid (FOLVITE) 1 MG tablet, Take 1 mg by mouth daily., Disp: , Rfl:    furosemide (LASIX) 20 MG tablet, TAKE 1 TABLET BY MOUTH EVERY DAY, Disp: 90 tablet, Rfl: 3   glucose blood (ACCU-CHEK GUIDE) test strip, USE TEST BLOOD SUGAR DAILY. DX: E11.9, Disp: 100 each, Rfl: 12   guaiFENesin (MUCINEX) 600 MG 12 hr tablet, Take 1 tablet (600 mg total) by mouth 2 (two) times daily., Disp: 30 tablet, Rfl: 0   Lancet Device MISC, Use to test BG once daily, Disp: 100 each, Rfl: 3   methotrexate (RHEUMATREX) 2.5 MG tablet, SMARTSIG:3 Tablet(s)  By Mouth Once a Week, Disp: , Rfl:    SitaGLIPtin-MetFORMIN HCl (JANUMET XR) [id number] MG TB24, Take 1 tablet by mouth daily., Disp: 90 tablet, Rfl: 0   triamcinolone cream (KENALOG) 0.1 %, Apply 1 application topically 2 (two) times daily. X10 days, Disp: 60 g, Rfl: 0 Social History   Socioeconomic History   Marital status: Married    Spouse name: Not on file   Number of children: Not on file   Years of education: Not on file   Highest education level: Not on file  Occupational History   Not on file  Tobacco Use   Smoking status: Never    Smokeless tobacco: Never  Vaping Use   Vaping Use: Never used  Substance and Sexual Activity   Alcohol use: Never   Drug use: Never   Sexual activity: Not on file  Other Topics Concern   Not on file  Social History Narrative   Not on file   Social Determinants of Health   Financial Resource Strain: Not on file  Food Insecurity: Not on file  Transportation Needs: Not on file  Physical Activity: Not on file  Stress: Not on file  Social Connections: Not on file  Intimate Partner Violence: Not on file   Family History  Problem Relation Age of Onset   Heart attack Mother    Cancer Father    CVA Maternal Grandmother    Heart attack Maternal Grandfather    Cancer Sister    Early death Brother    Heart attack Sister    Healthy Sister     Objective: Office vital signs reviewed. BP 132/73   Pulse 68   Temp 98.2 F (36.8 C)   Ht $R'5\' 1"'xo$  (1.549 m)   Wt 245 lb 3.2 oz (111.2 kg)   SpO2 93%   BMI 46.33 kg/m   Physical Examination:  General: Awake, alert, morbidly obese female, No acute distress HEENT: Sclera white Cardio: regular rate and rhythm, J2I7 heard, systolic murmur present Pulm: clear to auscultation bilaterally, no wheezes, rhonchi or rales; normal work of breathing on room air Extremities: warm, well perfused, No edema, cyanosis or clubbing; +2 pulses bilaterally MSK: Bleeding independently but gait is antalgic.  No erythema or warmth to the knees appreciated. Neuro: See foot exam  Diabetic Foot Exam - Simple   Simple Foot Form Diabetic Foot exam was performed with the following findings: Yes 05/06/2022  9:22 AM  Visual Inspection No deformities, no ulcerations, no other skin breakdown bilaterally: Yes Sensation Testing Intact to touch and monofilament testing bilaterally: Yes Pulse Check Posterior Tibialis and Dorsalis pulse intact bilaterally: Yes Comments Onychomycotic changes noted to the toenails bilaterally    Assessment/ Plan: 61 y.o. female    Type 2 diabetes mellitus with diabetic polyneuropathy, unspecified whether long term insulin use (HCC) - Plan: Bayer DCA Hb A1c Waived  Hyperlipidemia associated with type 2 diabetes mellitus ([place]) - Plan: Lipid Panel  Hypertension associated with diabetes ([place])  Severe obesity (BMI >= 40) (HCC)  Primary osteoarthritis of both knees  Aortic valve sclerosis  Asymptomatic postmenopausal estrogen deficiency - Plan: DG [facility] DEXA  Sugar remains under excellent control with A1c of 6.6 today.  No changes.  Encouraged to follow-up with triage nurse if needing assistance with freestyle libre instructions  Check fasting lipid panel  Blood pressure is under good control.  No changes  Suspect that she will need some weight loss prior to any type of surgery planned.  May need to  consider GLP in this patient to assist.  Advised to follow-up with orthopedics if knees have been getting worse.  Murmur noted on exam and this is secondary to aortic valve sclerosis.  She had ultrasound updated in 2019  DEXA scan ordered today, particularly given use of methotrexate and postmenopausal state.  No orders of the defined types were placed in this encounter.  No orders of the defined types were placed in this encounter.    [name], DO [place] [phone]

## 2022-05-07 LAB — LIPID PANEL
Chol/HDL Ratio: 3.6 ratio (ref 0.0–4.4)
Cholesterol, Total: 119 mg/dL (ref 100–199)
HDL: 33 mg/dL — ABNORMAL LOW (ref >39)
LDL Chol Calc (NIH): 56 mg/dL (ref 0–99)
Triglycerides: 177 mg/dL — ABNORMAL HIGH (ref 0–149)
VLDL Cholesterol Cal: 30 mg/dL (ref 5–40)

## 2022-05-08 LAB — VITAMIN D 25 HYDROXY (VIT D DEFICIENCY, FRACTURES): Vit D, 25-Hydroxy: 31.5 ng/mL (ref 30.0–100.0)

## 2022-05-08 LAB — SPECIMEN STATUS REPORT

## 2022-05-28 LAB — HM DIABETES EYE EXAM

## 2022-06-05 ENCOUNTER — Encounter: Payer: Self-pay | Admitting: Family Medicine

## 2022-06-05 ENCOUNTER — Ambulatory Visit (INDEPENDENT_AMBULATORY_CARE_PROVIDER_SITE_OTHER): Payer: BC Managed Care – PPO | Admitting: Family Medicine

## 2022-06-05 DIAGNOSIS — M810 Age-related osteoporosis without current pathological fracture: Secondary | ICD-10-CM

## 2022-06-05 MED ORDER — RISEDRONATE SODIUM 150 MG PO TABS: 150.0000 mg | ORAL_TABLET | ORAL | 4 refills | Status: DC

## 2022-06-05 MED ORDER — VITAMIN D (ERGOCALCIFEROL) 1.25 MG (50000 UNIT) PO CAPS: 50000.0000 [IU] | ORAL_CAPSULE | ORAL | 1 refills | Status: DC

## 2022-06-05 NOTE — Progress Notes (Signed)
Telephone visit  Subjective: [id number] PCP: [name], DO Carla Mason is a 61 y.o. female calls for telephone consult today. Patient provides verbal consent for consult held via phone.  Due to COVID-19 pandemic this visit was conducted virtually. This visit type was conducted due to national recommendations for restrictions regarding the COVID-19 Pandemic (e.g. social distancing, sheltering in place) in an effort to limit this patient's exposure and mitigate transmission in our community. All issues noted in this document were discussed and addressed.  A physical exam was not performed with this format.   Location of patient: home Location of provider: [facility] Others present for call: none  1. Osteoporosis New diagnosis.  She had a DEXA scan done May 06, 2022 which showed a T score of -2.6 in the right femoral neck.  She does take vitamin D but does not take calcium.  Her last calcium and vitamin D levels were technically within normal range.  She is treated with methotrexate for her skin.  No planned dental work   ROS: Per HPI  No Known Allergies Past Medical History:  Diagnosis Date   Diabetes mellitus without complication ([place])    Hyperlipidemia    Hypertension    Neuropathy    Obesity    Vitamin D deficiency disease     Current Outpatient Medications:    Accu-Chek Softclix Lancets lancets, Use as instructed, Disp: 100 each, Rfl: 12   amLODipine-olmesartan (AZOR) 5-40 MG tablet, TAKE 1 TABLET BY MOUTH EVERY DAY, Disp: 90 tablet, Rfl: 0   aspirin 81 MG chewable tablet, Chew 81 mg by mouth daily., Disp: , Rfl:    atorvastatin (LIPITOR) 40 MG tablet, TAKE 1 TABLET BY MOUTH EVERY DAY, Disp: 90 tablet, Rfl: 0   betamethasone, augmented, (DIPROLENE) 0.05 % lotion, APPLY TO AFFECTED AREA TWICE A DAY, Disp: 60 mL, Rfl: 2   blood glucose meter kit and supplies, Dispense based on patient and insurance preference (wants freestyle). Use up to one time daily as  directed. (FOR ICD-10 E10.9, E11.9)., Disp: 1 each, Rfl: 0   cholecalciferol (VITAMIN D) 1000 UNITS tablet, Take 2,000 Units by mouth daily., Disp: , Rfl:    clobetasol ointment (TEMOVATE) 0.05 %, Apply topically daily., Disp: , Rfl:    Continuous Blood Gluc Receiver (FREESTYLE LIBRE 2 READER) DEVI, 1 EACH BY DOES NOT APPLY ROUTE, Disp: 1 each, Rfl: 0   Continuous Blood Gluc Sensor (FREESTYLE LIBRE 2 SENSOR) MISC, Use as directed every 14 days. E11.9, Disp: 2 each, Rfl: 12   fluticasone (FLONASE) 50 MCG/ACT nasal spray, Place 2 sprays into both nostrils daily., Disp: 16 g, Rfl: 6   folic acid (FOLVITE) 1 MG tablet, Take 1 mg by mouth daily., Disp: , Rfl:    furosemide (LASIX) 20 MG tablet, TAKE 1 TABLET BY MOUTH EVERY DAY, Disp: 90 tablet, Rfl: 3   glucose blood (ACCU-CHEK GUIDE) test strip, USE TEST BLOOD SUGAR DAILY. DX: E11.9, Disp: 100 each, Rfl: 12   guaiFENesin (MUCINEX) 600 MG 12 hr tablet, Take 1 tablet (600 mg total) by mouth 2 (two) times daily., Disp: 30 tablet, Rfl: 0   Lancet Device MISC, Use to test BG once daily, Disp: 100 each, Rfl: 3   methotrexate (RHEUMATREX) 2.5 MG tablet, SMARTSIG:3 Tablet(s) By Mouth Once a Week, Disp: , Rfl:    SitaGLIPtin-MetFORMIN HCl (JANUMET XR) [phone] MG TB24, Take 1 tablet by mouth daily., Disp: 90 tablet, Rfl: 0   triamcinolone cream (KENALOG) 0.1 %, Apply 1 application topically  2 (two) times daily. X10 days, Disp: 60 g, Rfl: 0  DG [facility] DEXA  Result Date: 05/06/2022 EXAM: DUAL X-RAY ABSORPTIOMETRY (DXA) FOR BONE MINERAL DENSITY 05/06/2022 9:22 am CLINICAL DATA:  61 year old Female Postmenopausal. screen osteoporosis TECHNIQUE: An axial (e.g., hips, spine) and/or appendicular (e.g., radius) exam was performed, as appropriate, using GE Nature conservation officer at [place]. Images are obtained for bone mineral density measurement and are not obtained for diagnostic purposes. [id number] Exclusions: None. COMPARISON:   None. FINDINGS: Scan quality: Good. LUMBAR SPINE (L1-L4): BMD (in g/cm2): 0.930 T-score: -2.1 Z-score: -1.9 LEFT FEMORAL NECK: BMD (in g/cm2): 0.755 T-score: -2.0 Z-score: -1.4 LEFT TOTAL HIP: BMD (in g/cm2): 0.717 T-score: -2.3 Z-score: -2.1 RIGHT FEMORAL NECK: BMD (in g/cm2): 0.678 T-score: -2.6 Z-score: -2.0 RIGHT TOTAL HIP: BMD (in g/cm2): 0.734 T-score: -2.2 Z-score: -1.9 FRAX 10-YEAR PROBABILITY OF FRACTURE: Patient does not meet criteria for FRAX assessment. IMPRESSION: Osteoporosis based on BMD. World Health Organization T-score criteria for osteoporosis Normal = T-score at or above -1.0 SD. Low Bone Mass (Osteopenia) = T-score between -1.0 and -2.5 SD. Osteoporosis = T-score at or below -2.5 SD. Fracture risk is increased. Increased risk is based on low BMD. RECOMMENDATIONS: 1. All patients should optimize calcium and vitamin D intake. 2. Consider FDA-approved medical therapies in postmenopausal women and men aged 70 years and older, based on the following: - A hip or vertebral (clinical or morphometric) fracture - T-score less than or equal to -2.5 and secondary causes have been excluded. - Low bone mass (T-score between -1.0 and -2.5) and a 10-year probability of a hip fracture greater than or equal to 3% or a 10-year probability of a major osteoporosis-related fracture greater than or equal to 20% based on the [place]-adapted WHO algorithm. - Clinician judgment and/or patient preferences may indicate treatment for people with 10-year fracture probabilities above or below these levels 3. Patients with diagnosis of osteoporosis or at high risk for fracture should have regular bone mineral density tests. For patients eligible for Medicare, routine testing is allowed once every 2 years. The testing frequency can be increased to one year for patients who have rapidly progressing disease, those who are receiving or discontinuing medical therapy to restore bone mass, or have additional risk factors. Electronically  Signed   By: Ammie Ferrier M.D.   On: 05/06/2022 10:04    Assessment/ Plan: 61 y.o. female   Age-related osteoporosis without current pathological fracture - Plan: Vitamin D, Ergocalciferol, (DRISDOL) 1.25 MG (50000 UNIT) CAPS capsule, risedronate (ACTONEL) 150 MG tablet  No contraindication use of bisphosphonates.  I gave her options including Prolia today.  We will start her on vitamin D prescription since she was on the low end of normal with over-the-counter vitamin D.  Actonel once monthly prescribed.  May need to consider Fosamax if insurance does not pay for the Actonel.  We will plan for repeat DEXA scan in 2 years and CMP, vitamin D level in 4-6 months.  Start time: 5:04pm End time: 5:15pm  Total time spent on patient care (including telephone call/ virtual visit): 11 minutes  [place], [place] [phone]

## 2022-06-25 ENCOUNTER — Telehealth: Payer: Self-pay

## 2022-06-25 NOTE — Telephone Encounter (Signed)
PT STATES METHOTREXATE IS THE MED RX'D BY [facility]ER[facility] FOR 4X A WEEK AND SHE WILL rLK TO [facility] THEN

## 2022-06-25 NOTE — Telephone Encounter (Signed)
Not related to vit d but methotrexate CAN cause nose bleeds, though it is not shown frequently in the literature.

## 2022-06-25 NOTE — Telephone Encounter (Signed)
Last week her nose bled 3 times, yesterday morning it was bleeding and also this morning.   She wants to know if vit d is causing this or if the 4 pills she taked on Saturday rx'd by dermatology- unsure of name could be causing this

## 2022-07-12 DIAGNOSIS — B001 Herpesviral vesicular dermatitis: Secondary | ICD-10-CM | POA: Diagnosis not present

## 2022-07-12 DIAGNOSIS — L304 Erythema intertrigo: Secondary | ICD-10-CM | POA: Diagnosis not present

## 2022-07-12 DIAGNOSIS — L900 Lichen sclerosus et atrophicus: Secondary | ICD-10-CM | POA: Diagnosis not present

## 2022-07-21 ENCOUNTER — Other Ambulatory Visit: Payer: Self-pay | Admitting: Family Medicine

## 2022-07-21 DIAGNOSIS — E1169 Type 2 diabetes mellitus with other specified complication: Secondary | ICD-10-CM

## 2022-08-14 ENCOUNTER — Telehealth: Payer: Self-pay | Admitting: Family Medicine

## 2022-08-14 DIAGNOSIS — E1159 Type 2 diabetes mellitus with other circulatory complications: Secondary | ICD-10-CM

## 2022-08-14 MED ORDER — AMLODIPINE-OLMESARTAN 5-40 MG PO TABS: 1.0000 | ORAL_TABLET | Freq: Every day | ORAL | 3 refills | Status: DC

## 2022-08-14 NOTE — Telephone Encounter (Signed)
[facility] SENT. PT AWARE

## 2022-08-14 NOTE — Telephone Encounter (Signed)
  Prescription Request  08/14/2022   What is the name of the medication or equipment? AMLODIPINE  Have you contacted your pharmacy to request a refill? YES  Which pharmacy would you like this sent to? [facility]  Pt is out of refills and needs more sent in. Per last check up in August, pt was told she didn't need to come back for 6 mths which she is scheduled for in February.

## 2022-10-20 ENCOUNTER — Other Ambulatory Visit: Payer: Self-pay | Admitting: Family Medicine

## 2022-10-20 DIAGNOSIS — M810 Age-related osteoporosis without current pathological fracture: Secondary | ICD-10-CM

## 2022-11-06 ENCOUNTER — Other Ambulatory Visit: Payer: Self-pay | Admitting: Family Medicine

## 2022-11-06 DIAGNOSIS — E1169 Type 2 diabetes mellitus with other specified complication: Secondary | ICD-10-CM

## 2022-11-08 ENCOUNTER — Ambulatory Visit (INDEPENDENT_AMBULATORY_CARE_PROVIDER_SITE_OTHER): Payer: BC Managed Care – PPO | Admitting: Family Medicine

## 2022-11-08 ENCOUNTER — Encounter: Payer: Self-pay | Admitting: Family Medicine

## 2022-11-08 VITALS — BP 130/77 | HR 70 | Temp 97.9°F | Ht 61.0 in | Wt 246.0 lb

## 2022-11-08 DIAGNOSIS — M810 Age-related osteoporosis without current pathological fracture: Secondary | ICD-10-CM

## 2022-11-08 DIAGNOSIS — Z0001 Encounter for general adult medical examination with abnormal findings: Secondary | ICD-10-CM | POA: Diagnosis not present

## 2022-11-08 DIAGNOSIS — E1142 Type 2 diabetes mellitus with diabetic polyneuropathy: Secondary | ICD-10-CM

## 2022-11-08 DIAGNOSIS — Z6841 Body Mass Index (BMI) 40.0 and over, adult: Secondary | ICD-10-CM

## 2022-11-08 DIAGNOSIS — R609 Edema, unspecified: Secondary | ICD-10-CM | POA: Diagnosis not present

## 2022-11-08 DIAGNOSIS — Z90711 Acquired absence of uterus with remaining cervical stump: Secondary | ICD-10-CM

## 2022-11-08 DIAGNOSIS — E1169 Type 2 diabetes mellitus with other specified complication: Secondary | ICD-10-CM

## 2022-11-08 DIAGNOSIS — E785 Hyperlipidemia, unspecified: Secondary | ICD-10-CM

## 2022-11-08 DIAGNOSIS — I152 Hypertension secondary to endocrine disorders: Secondary | ICD-10-CM

## 2022-11-08 DIAGNOSIS — Z0000 Encounter for general adult medical examination without abnormal findings: Secondary | ICD-10-CM

## 2022-11-08 DIAGNOSIS — Z124 Encounter for screening for malignant neoplasm of cervix: Secondary | ICD-10-CM

## 2022-11-08 DIAGNOSIS — E1159 Type 2 diabetes mellitus with other circulatory complications: Secondary | ICD-10-CM

## 2022-11-08 LAB — BAYER DCA HB A1C WAIVED: HB A1C (BAYER DCA - WAIVED): 8.2 % — ABNORMAL HIGH (ref 4.8–5.6)

## 2022-11-08 MED ORDER — JANUMET XR 100-1000 MG PO TB24: 1.0000 | ORAL_TABLET | Freq: Every day | ORAL | 3 refills | Status: DC

## 2022-11-08 MED ORDER — FUROSEMIDE 20 MG PO TABS: 20.0000 mg | ORAL_TABLET | Freq: Every day | ORAL | 3 refills | Status: DC

## 2022-11-08 MED ORDER — AMLODIPINE-OLMESARTAN 5-40 MG PO TABS: 1.0000 | ORAL_TABLET | Freq: Every day | ORAL | 3 refills | Status: DC

## 2022-11-08 MED ORDER — FREESTYLE LIBRE 2 SENSOR MISC: 12 refills | Status: DC

## 2022-11-08 MED ORDER — ATORVASTATIN CALCIUM 40 MG PO TABS: 40.0000 mg | ORAL_TABLET | Freq: Every day | ORAL | 3 refills | Status: DC

## 2022-11-08 MED ORDER — GLIPIZIDE ER 10 MG PO TB24: 10.0000 mg | ORAL_TABLET | Freq: Every day | ORAL | 3 refills | Status: DC
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
## 2022-11-08 NOTE — Patient Instructions (Signed)
Osteoporosis  Osteoporosis is when the bones get thin and weak. This can cause your bones to break (fracture) more easily. What are the causes? The exact cause of this condition is not known. What increases the risk? Having family members with this condition. Not eating enough healthy foods. Taking certain medicines. Being female. Being age 64 or older. Smoking or using other products that contain nicotine or tobacco, such as e-cigarettes or chewing tobacco. Not exercising. Being of European or Asian ancestry. Having a small body frame. What are the signs or symptoms? A broken bone might be the first sign, especially if the break results from a fall or injury that usually would not cause a bone to break. Other signs and symptoms include: Pain in the neck or low back. Being hunched over (stooped posture). Getting shorter. How is this treated? Eating more foods with more calcium and vitamin D in them. Doing exercises. Stopping tobacco use. Limiting how much alcohol you drink. Taking medicines to slow bone loss or help make the bones stronger. Taking supplements of calcium and vitamin D every day. Taking medicines to replace chemicals in the body (hormone replacement medicines). Monitoring your levels of calcium and vitamin D. The goal of treatment is to strengthen your bones and lower your risk for a bone break. Follow these instructions at home: Eating and drinking Eat plenty of calcium and vitamin D. These nutrients are good for your bones. Good sources of calcium and vitamin D include: Some fish, such as salmon and tuna. Foods that have calcium and vitamin D added to them (fortified foods), such as some breakfast cereals. Egg yolks. Cheese. Liver.  Activity Do exercises as told by your doctor. Ask your doctor what exercises are safe for you. You should do: Exercises that make your muscles work to hold your body weight up (weight-bearing exercises). These include tai chi,  yoga, and walking. Exercises to make your muscles stronger. One example is lifting weights. Lifestyle Do not drink alcohol if: Your doctor tells you not to drink. You are pregnant, may be pregnant, or are planning to become pregnant. If you drink alcohol: Limit how much you use to: 0-1 drink a day for women. 0-2 drinks a day for men. Know how much alcohol is in your drink. In the [place] one drink equals one 12 oz bottle of beer (355 mL), one 5 oz glass of wine (148 mL), or one 1 oz glass of hard liquor (44 mL). Do not smoke or use any products that contain nicotine or tobacco. If you need help quitting, ask your doctor. Preventing falls Use tools to help you move around (mobility aids) as needed. These include canes, walkers, scooters, and crutches. Keep rooms well-lit. Put away things on the floor that could make you trip. These include cords and rugs. Install safety rails on stairs. Install grab bars in bathrooms. Use rubber mats in slippery areas, like bathrooms. Wear shoes that: Fit you well. Support your feet. Have closed toes. Have rubber soles or low heels. Tell your doctor about all of the medicines you are taking. Some medicines can make you more likely to fall. General instructions Take over-the-counter and prescription medicines only as told by your doctor. Keep all follow-up visits. Contact a doctor if: You have not been tested (screened) for osteoporosis and you are: A woman who is age 65 or older. A man who is age 70 or older. Get help right away if: You fall. You get hurt. Summary Osteoporosis happens when your   bones get thin and weak. Weak bones can break (fracture) more easily. Eat plenty of calcium and vitamin D. These are good for your bones. Tell your doctor about all of the medicines that you take. This information is not intended to replace advice given to you by your health care provider. Make sure you discuss any questions you have with your health care  provider. Document Revised: 02/24/2020 Document Reviewed: 02/24/2020 Elsevier Patient Education  2023 Elsevier Inc.  

## 2022-11-10 LAB — MICROALBUMIN / CREATININE URINE RATIO
Creatinine, Urine: 50.8 mg/dL
Microalb/Creat Ratio: <6 mg/g creat (ref 0–29)
Microalbumin, Urine: <3 ug/mL

## 2022-11-12 ENCOUNTER — Telehealth: Payer: Self-pay | Admitting: Family Medicine

## 2022-11-12 NOTE — Telephone Encounter (Signed)
-----   Message from Carla Norlander, DO sent at 11/08/2022 12:59 PM EST ----- Pt wants prolia if affordable.  Could not tolerate Actonel.

## 2022-11-15 DIAGNOSIS — L900 Lichen sclerosus et atrophicus: Secondary | ICD-10-CM | POA: Diagnosis not present

## 2022-11-15 DIAGNOSIS — L918 Other hypertrophic disorders of the skin: Secondary | ICD-10-CM | POA: Diagnosis not present

## 2022-11-19 NOTE — Telephone Encounter (Signed)
Verifying Benefits

## 2022-12-19 ENCOUNTER — Other Ambulatory Visit (HOSPITAL_COMMUNITY): Payer: Self-pay

## 2023-01-02 NOTE — Telephone Encounter (Signed)
Prolia VOB initiated via [email or web address] 

## 2023-01-06 ENCOUNTER — Other Ambulatory Visit (HOSPITAL_COMMUNITY): Payer: Self-pay

## 2023-01-07 NOTE — Telephone Encounter (Signed)
Submitted chart notes to her insurance for the prior authorization for Prolia.   Faxed to [phone]  Reference # [id number]

## 2023-01-22 ENCOUNTER — Telehealth: Payer: Self-pay

## 2023-01-22 ENCOUNTER — Other Ambulatory Visit (HOSPITAL_COMMUNITY): Payer: Self-pay

## 2023-01-22 NOTE — Telephone Encounter (Signed)
Pt ready for scheduling for Prolia on or after : 01/22/23  Out-of-pocket cost due at time of visit: $327  Primary: [employer] of [place] - Commercial Prolia co-insurance: 20% Admin fee co-insurance: 20%  Secondary: N/A Prolia co-insurance:  Admin fee co-insurance:   Medical Benefit Details: Date Benefits were checked: 01/08/23 Deductible: $500 ($161.09 met)/ Coinsurance: 20%/ Admin Fee: 20%  Prior Auth: approved PA# [id number] Expiration Date: 01/09/23 to 01/21/24   Pharmacy benefit: Copay Plan prefers medical buy and bill If patient wants fill through the pharmacy benefit please send prescription to:  [facility] , and include estimated need by date in rx notes. Pharmacy will ship medication directly to the office.  Patient MAY eligible for Prolia Copay Card. Copay Card can make patient's cost as little as $25. Link to apply: [email or web address]  ** This summary of benefits is an estimation of the patient's out-of-pocket cost. Exact cost may very based on individual plan coverage.

## 2023-02-03 NOTE — Telephone Encounter (Signed)
LMTCB to discuss price on Prolia with patient to make sure she is still interested in starting.

## 2023-02-07 ENCOUNTER — Ambulatory Visit: Payer: BC Managed Care – PPO | Admitting: Family Medicine

## 2023-02-07 ENCOUNTER — Encounter: Payer: Self-pay | Admitting: Family Medicine

## 2023-02-07 VITALS — BP 134/82 | HR 73 | Temp 98.5°F | Ht 61.0 in | Wt 244.2 lb

## 2023-02-07 DIAGNOSIS — E1142 Type 2 diabetes mellitus with diabetic polyneuropathy: Secondary | ICD-10-CM | POA: Diagnosis not present

## 2023-02-07 DIAGNOSIS — Z7984 Long term (current) use of oral hypoglycemic drugs: Secondary | ICD-10-CM

## 2023-02-07 DIAGNOSIS — E1159 Type 2 diabetes mellitus with other circulatory complications: Secondary | ICD-10-CM

## 2023-02-07 DIAGNOSIS — E1169 Type 2 diabetes mellitus with other specified complication: Secondary | ICD-10-CM

## 2023-02-07 DIAGNOSIS — E785 Hyperlipidemia, unspecified: Secondary | ICD-10-CM | POA: Diagnosis not present

## 2023-02-07 DIAGNOSIS — I152 Hypertension secondary to endocrine disorders: Secondary | ICD-10-CM

## 2023-02-07 LAB — BAYER DCA HB A1C WAIVED: HB A1C (BAYER DCA - WAIVED): 7.2 % — ABNORMAL HIGH (ref 4.8–5.6)

## 2023-02-07 NOTE — Progress Notes (Signed)
Subjective: CC:DM PCP: Raliegh Ip, DO BJY:NWGNF Carla Mason is a 62 y.o. female presenting to clinic today for:  1. Type 2 Diabetes with hypertension, hyperlipidemia:  Patient never did start the bedside because she was scared of having a hypoglycemic episode.  She brings today her freestyle libre meter which she would like instructions on how to apply.  She is compliant with her Janumet, Azor, Lipitor  Last eye exam: UTD Last foot exam: UTD Last A1c:  Lab Results  Component Value Date   HGBA1C 8.2 (H) 11/08/2022   Nephropathy screen indicated?: UTD Last flu, zoster and/or pneumovax:  Immunization History  Administered Date(s) Administered   Influenza-Unspecified 06/23/2017, 07/05/2020   PFIZER(Purple Top)SARS-COV-2 Vaccination 06/03/2020, 06/23/2020   Td 05/15/2011   Td (Adult) 05/15/2011   Tdap 05/15/2011    ROS: No chest pain, shortness of breath or dizziness    ROS: Per HPI  No Known Allergies Past Medical History:  Diagnosis Date   Diabetes mellitus without complication (H[facility])    Hyperlipidemia    Hypertension    Neuropathy    Obesity    Vitamin D deficiency disease     Current Outpatient Medications:    Accu-Chek Softclix Lancets lancets, Use as instructed, Disp: 100 each, Rfl: 12   amLODipine-olmesartan (AZOR) 5-40 MG tablet, Take 1 tablet by mouth daily., Disp: 90 tablet, Rfl: 3   aspirin 81 MG chewable tablet, Chew 81 mg by mouth daily., Disp: , Rfl:    atorvastatin (LIPITOR) 40 MG tablet, Take 1 tablet (40 mg total) by mouth daily., Disp: 90 tablet, Rfl: 3   betamethasone, augmented, (DIPROLENE) 0.05 % lotion, APPLY TO AFFECTED AREA TWICE A DAY, Disp: 60 mL, Rfl: 2   blood glucose meter kit and supplies, Dispense based on patient and insurance preference (wants freestyle). Use up to one time daily as directed. (FOR ICD-10 E10.9, E11.9)., Disp: 1 each, Rfl: 0   cholecalciferol (VITAMIN D) 1000 UNITS tablet, Take 2,000 Units by mouth daily., Disp: ,  Rfl:    clobetasol ointment (TEMOVATE) 0.05 %, Apply topically daily., Disp: , Rfl:    Continuous Blood Gluc Receiver (FREESTYLE LIBRE 2 READER) DEVI, 1 EACH BY DOES NOT APPLY ROUTE, Disp: 1 each, Rfl: 0   Continuous Blood Gluc Sensor (FREESTYLE LIBRE 2 SENSOR) MISC, Use as directed every 14 days. E11.9, Disp: 2 each, Rfl: 12   fluticasone (FLONASE) 50 MCG/ACT nasal spray, Place 2 sprays into both nostrils daily., Disp: 16 g, Rfl: 6   folic acid (FOLVITE) 1 MG tablet, Take 1 mg by mouth daily., Disp: , Rfl:    furosemide (LASIX) 20 MG tablet, Take 1 tablet (20 mg total) by mouth daily., Disp: 90 tablet, Rfl: 3   glipiZIDE (GLUCOTROL XL) 10 MG 24 hr tablet, Take 1 tablet (10 mg total) by mouth daily with breakfast. For diabetes, Disp: 90 tablet, Rfl: 3   glucose blood (ACCU-CHEK GUIDE) test strip, USE TEST BLOOD SUGAR DAILY. DX: E11.9, Disp: 100 each, Rfl: 12   guaiFENesin (MUCINEX) 600 MG 12 hr tablet, Take 1 tablet (600 mg total) by mouth 2 (two) times daily., Disp: 30 tablet, Rfl: 0   Lancet Device MISC, Use to test BG once daily, Disp: 100 each, Rfl: 3   SitaGLIPtin-MetFORMIN HCl (JANUMET XR) [phone] MG TB24, Take 1 tablet by mouth daily., Disp: 90 tablet, Rfl: 3   triamcinolone cream (KENALOG) 0.1 %, Apply 1 application topically 2 (two) times daily. X10 days, Disp: 60 g, Rfl: 0  Vitamin D, Ergocalciferol, (DRISDOL) 1.25 MG (50000 UNIT) CAPS capsule, TAKE 1 CAPSULE (50,000 UNITS TOTAL) BY MOUTH EVERY 7 (SEVEN) DAYS, Disp: 12 capsule, Rfl: 1 Social History   Socioeconomic History   Marital status: Married    Spouse name: Not on file   Number of children: Not on file   Years of education: Not on file   Highest education level: Not on file  Occupational History   Not on file  Tobacco Use   Smoking status: Never   Smokeless tobacco: Never  Vaping Use   Vaping Use: Never used  Substance and Sexual Activity   Alcohol use: Never   Drug use: Never   Sexual activity: Not on file  Other  Topics Concern   Not on file  Social History Narrative   Not on file   Social Determinants of Health   Financial Resource Strain: Not on file  Food Insecurity: Not on file  Transportation Needs: Not on file  Physical Activity: Not on file  Stress: Not on file  Social Connections: Not on file  Intimate Partner Violence: Not on file   Family History  Problem Relation Age of Onset   Heart attack Mother    Cancer Father    CVA Maternal Grandmother    Heart attack Maternal Grandfather    Cancer Sister    Early death Brother    Heart attack Sister    Healthy Sister     Objective: Office vital signs reviewed. BP 134/82   Pulse 73   Temp 98.5 F (36.9 C)   Ht 5\' 1"  (1.549 m)   Wt 244 lb 3.2 oz (110.8 kg)   SpO2 94%   BMI 46.14 kg/m   Physical Examination:  General: Awake, alert, well nourished, No acute distress HEENT: sclera white, MMM Cardio: regular rate and rhythm, S1S2 heard, +systolic murmurs appreciated Pulm: clear to auscultation bilaterally, no wheezes, rhonchi or rales; normal work of breathing on room air    Assessment/ Plan: 62 y.o. female   Type 2 diabetes mellitus with diabetic polyneuropathy, unspecified whether long term insulin use ([facility]) - Plan: Bayer DCA Hb A1c Waived, CMP14+EGFR  Hyperlipidemia associated with type 2 diabetes mellitus ([facility]) - Plan: CMP14+EGFR  Hypertension associated with diabetes ([facility]) - Plan: CMP14+EGFR  Sugar still not controlled but has dropped dramatically since our last checkup.  Down to 7.2.  I think it is fine for her to hold the glipizide for now.  We applied the freestyle libre together today and I think that she may be able to achieve goal with just very close monitoring of blood sugar.  I will see her back again in 3 months and if she still has not achieved blood sugar by that time, we will plan to proceed with glipizide addition.  Blood pressure controlled.  Not yet due for fasting lipid.  No orders of the defined  types were placed in this encounter.  No orders of the defined types were placed in this encounter.    Raliegh Ip, DO [facility] [phone]

## 2023-02-08 LAB — CMP14+EGFR
ALT: 21 IU/L (ref 0–32)
AST: 18 IU/L (ref 0–40)
Albumin/Globulin Ratio: 1.7 (ref 1.2–2.2)
Albumin: 4.2 g/dL (ref 3.9–4.9)
Alkaline Phosphatase: 99 IU/L (ref 44–121)
BUN/Creatinine Ratio: 16 (ref 12–28)
BUN: 9 mg/dL (ref 8–27)
Bilirubin Total: 0.5 mg/dL (ref 0.0–1.2)
CO2: 24 mmol/L (ref 20–29)
Calcium: 9.4 mg/dL (ref 8.7–10.3)
Chloride: 101 mmol/L (ref 96–106)
Creatinine, Ser: 0.56 mg/dL — ABNORMAL LOW (ref 0.57–1.00)
Globulin, Total: 2.5 g/dL (ref 1.5–4.5)
Glucose: 141 mg/dL — ABNORMAL HIGH (ref 70–99)
Potassium: 4.1 mmol/L (ref 3.5–5.2)
Sodium: 141 mmol/L (ref 134–144)
Total Protein: 6.7 g/dL (ref 6.0–8.5)
eGFR: 102 mL/min/{1.73_m2} (ref >59)

## 2023-02-12 ENCOUNTER — Encounter: Payer: Self-pay | Admitting: Family Medicine

## 2023-03-01 DIAGNOSIS — L03039 Cellulitis of unspecified toe: Secondary | ICD-10-CM | POA: Diagnosis not present

## 2023-03-10 NOTE — Telephone Encounter (Signed)
LMTCB to discuss prolia.

## 2023-03-21 DIAGNOSIS — L304 Erythema intertrigo: Secondary | ICD-10-CM | POA: Diagnosis not present

## 2023-03-21 DIAGNOSIS — L900 Lichen sclerosus et atrophicus: Secondary | ICD-10-CM | POA: Diagnosis not present

## 2023-04-03 ENCOUNTER — Other Ambulatory Visit: Payer: Self-pay | Admitting: Family Medicine

## 2023-04-03 DIAGNOSIS — M810 Age-related osteoporosis without current pathological fracture: Secondary | ICD-10-CM

## 2023-04-25 ENCOUNTER — Ambulatory Visit: Payer: BC Managed Care – PPO | Admitting: Family Medicine

## 2023-04-25 ENCOUNTER — Encounter: Payer: Self-pay | Admitting: Family Medicine

## 2023-04-25 VITALS — BP 147/76 | HR 75 | Temp 97.3°F | Ht 61.0 in | Wt 249.6 lb

## 2023-04-25 DIAGNOSIS — L03032 Cellulitis of left toe: Secondary | ICD-10-CM

## 2023-04-25 MED ORDER — DOXYCYCLINE HYCLATE 100 MG PO TABS: 100.0000 mg | ORAL_TABLET | Freq: Two times a day (BID) | ORAL | 0 refills | Status: AC

## 2023-04-25 NOTE — Progress Notes (Signed)
Subjective:  Patient ID: Carla Mason, female    DOB: [birth year], 62 y.o.   MRN: [id number]  Patient Care Team: Raliegh Ip, DO as PCP - General (Family Medicine)   Chief Complaint:  Nail Problem (Left foot- almost 2 months )   HPI: Carla Mason is a 62 y.o. female presenting on 04/25/2023 for Nail Problem (Left foot- almost 2 months )   Pt presents today with left second toe erythema, swelling, and tenderness for the last 10 days. She was seen by UC for this in the recent past and treated with I&D and antibiotics, unsure what she was prescribed and this is not available in the EHR. States the infection cleared for several days and now the erythema has returned. She does get pedicures on a regular basis and reports she does not feel this was the cause. She has not seen podiatry in several years.         Relevant past medical, surgical, family, and social history reviewed and updated as indicated.  Allergies and medications reviewed and updated. Data reviewed: Chart in Epic.   Past Medical History:  Diagnosis Date   Diabetes mellitus without complication (HCC)    Hyperlipidemia    Hypertension    Neuropathy    Obesity    Vitamin D deficiency disease     Past Surgical History:  Procedure Laterality Date   ABDOMINAL HYSTERECTOMY     KNEE SURGERY      Social History   Socioeconomic History   Marital status: Married    Spouse name: Not on file   Number of children: Not on file   Years of education: Not on file   Highest education level: Not on file  Occupational History   Not on file  Tobacco Use   Smoking status: Never   Smokeless tobacco: Never  Vaping Use   Vaping status: Never Used  Substance and Sexual Activity   Alcohol use: Never   Drug use: Never   Sexual activity: Not on file  Other Topics Concern   Not on file  Social History Narrative   Not on file   Social Determinants of Health   Financial Resource Strain: Not on file  Food  Insecurity: Not on file  Transportation Needs: Not on file  Physical Activity: Not on file  Stress: Not on file  Social Connections: Unknown (02/01/2022)   [facility]   Social Network    Social Network: Not on file  Intimate Partner Violence: Unknown (12/25/2021)   Received from [facility]   HITS    Physically Hurt: Not on file    Insult or Talk Down To: Not on file    Threaten Physical Harm: Not on file    Scream or Curse: Not on file    Outpatient Encounter Medications as of 04/25/2023  Medication Sig   Accu-Chek Softclix Lancets lancets Use as instructed   amLODipine-olmesartan (AZOR) 5-40 MG tablet Take 1 tablet by mouth daily.   aspirin 81 MG chewable tablet Chew 81 mg by mouth daily.   atorvastatin (LIPITOR) 40 MG tablet Take 1 tablet (40 mg total) by mouth daily.   betamethasone, augmented, (DIPROLENE) 0.05 % lotion APPLY TO AFFECTED AREA TWICE A DAY   blood glucose meter kit and supplies Dispense based on patient and insurance preference (wants freestyle). Use up to one time daily as directed. (FOR ICD-10 E10.9, E11.9).   cholecalciferol (VITAMIN D) 1000 UNITS tablet Take 2,000 Units  by mouth daily.   clobetasol ointment (TEMOVATE) 0.05 % Apply topically daily.   Continuous Blood Gluc Receiver (FREESTYLE LIBRE 2 READER) DEVI 1 EACH BY DOES NOT APPLY ROUTE   Continuous Blood Gluc Sensor (FREESTYLE LIBRE 2 SENSOR) MISC Use as directed every 14 days. E11.9   doxycycline (VIBRA-TABS) 100 MG tablet Take 1 tablet (100 mg total) by mouth 2 (two) times daily for 10 days. 1 po bid   fluticasone (FLONASE) 50 MCG/ACT nasal spray Place 2 sprays into both nostrils daily.   folic acid (FOLVITE) 1 MG tablet Take 1 mg by mouth daily.   furosemide (LASIX) 20 MG tablet Take 1 tablet (20 mg total) by mouth daily.   glipiZIDE (GLUCOTROL XL) 10 MG 24 hr tablet Take 1 tablet (10 mg total) by mouth daily with breakfast. For diabetes   glucose blood (ACCU-CHEK GUIDE) test strip USE  TEST BLOOD SUGAR DAILY. DX: E11.9   guaiFENesin (MUCINEX) 600 MG 12 hr tablet Take 1 tablet (600 mg total) by mouth 2 (two) times daily.   Lancet Device MISC Use to test BG once daily   SitaGLIPtin-MetFORMIN HCl (JANUMET XR) [phone] MG TB24 Take 1 tablet by mouth daily.   triamcinolone cream (KENALOG) 0.1 % Apply 1 application topically 2 (two) times daily. X10 days   Vitamin D, Ergocalciferol, (DRISDOL) 1.25 MG (50000 UNIT) CAPS capsule TAKE 1 CAPSULE (50,000 UNITS TOTAL) BY MOUTH EVERY 7 (SEVEN) DAYS   No facility-administered encounter medications on file as of 04/25/2023.    No Known Allergies  Review of Systems  Constitutional:  Negative for activity change, appetite change, chills, fatigue and fever.  HENT: Negative.    Eyes: Negative.   Respiratory:  Negative for cough, chest tightness and shortness of breath.   Cardiovascular:  Negative for chest pain, palpitations and leg swelling.  Gastrointestinal:  Negative for blood in stool, constipation, diarrhea, nausea and vomiting.  Endocrine: Negative.   Genitourinary:  Negative for dysuria, frequency and urgency.  Musculoskeletal:  Negative for arthralgias and myalgias.  Skin:  Positive for color change and wound. Negative for pallor and rash.  Allergic/Immunologic: Negative.   Neurological:  Negative for dizziness and headaches.  Hematological: Negative.   Psychiatric/Behavioral:  Negative for confusion, hallucinations, sleep disturbance and suicidal ideas.   All other systems reviewed and are negative.       Objective:  BP (!) 147/76   Pulse 75   Temp (!) 97.3 F (36.3 C) (Temporal)   Ht 5\' 1"  (1.549 m)   Wt 249 lb 9.6 oz (113.2 kg)   SpO2 93%   BMI 47.16 kg/m    Wt Readings from Last 3 Encounters:  04/25/23 249 lb 9.6 oz (113.2 kg)  02/07/23 244 lb 3.2 oz (110.8 kg)  11/08/22 246 lb (111.6 kg)    Physical Exam Vitals and nursing note reviewed.  Constitutional:      General: She is not in acute distress.     Appearance: Normal appearance. She is morbidly obese. She is not ill-appearing, toxic-appearing or diaphoretic.  HENT:     Head: Normocephalic and atraumatic.     Mouth/Throat:     Mouth: Mucous membranes are moist.     Pharynx: Oropharynx is clear.  Eyes:     Pupils: Pupils are equal, round, and reactive to light.  Cardiovascular:     Rate and Rhythm: Normal rate and regular rhythm.  Pulmonary:     Effort: Pulmonary effort is normal.     Breath sounds: Normal breath  sounds.  Musculoskeletal:       Feet:  Feet:     Left foot:     Skin integrity: Erythema and warmth present.  Skin:    General: Skin is warm and dry.     Capillary Refill: Capillary refill takes less than 2 seconds.  Neurological:     General: No focal deficit present.     Mental Status: She is alert and oriented to person, place, and time.  Psychiatric:        Mood and Affect: Mood normal.        Behavior: Behavior normal. Behavior is cooperative.        Thought Content: Thought content normal.        Judgment: Judgment normal.     Results for orders placed or performed in visit on 02/07/23  Bayer DCA Hb A1c Waived  Result Value Ref Range   HB A1C (BAYER DCA - WAIVED) 7.2 (H) 4.8 - 5.6 %  CMP14+EGFR  Result Value Ref Range   Glucose 141 (H) 70 - 99 mg/dL   BUN 9 8 - 27 mg/dL   Creatinine, Ser 1.61 (L) 0.57 - 1.00 mg/dL   eGFR 096 >04 VW/UJW/1.19   BUN/Creatinine Ratio 16 12 - 28   Sodium 141 134 - 144 mmol/L   Potassium 4.1 3.5 - 5.2 mmol/L   Chloride 101 96 - 106 mmol/L   CO2 24 20 - 29 mmol/L   Calcium 9.4 8.7 - 10.3 mg/dL   Total Protein 6.7 6.0 - 8.5 g/dL   Albumin 4.2 3.9 - 4.9 g/dL   Globulin, Total 2.5 1.5 - 4.5 g/dL   Albumin/Globulin Ratio 1.7 1.2 - 2.2   Bilirubin Total 0.5 0.0 - 1.2 mg/dL   Alkaline Phosphatase 99 44 - 121 IU/L   AST 18 0 - 40 IU/L   ALT 21 0 - 32 IU/L       Pertinent labs & imaging results that were available during my care of the patient were reviewed by me and  considered in my medical decision making.  Assessment & Plan:  Manuela was seen today for nail problem.  Diagnoses and all orders for this visit:  Paronychia of second toe of left foot Discussed the importance of good foot care with the diagnosis of diabetes. Discussed the risks of having pedicures. Aware she needs to follow up with Dr. Ulice Brilliant. Has follow up with PCP in 2 weeks for chronic follow up, can reevaluate toe at this time. Aware to report any new, worsening, or persistent symptoms. Medications as prescribed.  -     doxycycline (VIBRA-TABS) 100 MG tablet; Take 1 tablet (100 mg total) by mouth 2 (two) times daily for 10 days. 1 po bid     Continue all other maintenance medications.  Follow up plan: Return if symptoms worsen or fail to improve.   Continue healthy lifestyle choices, including diet (rich in fruits, vegetables, and lean proteins, and low in salt and simple carbohydrates) and exercise (at least 30 minutes of moderate physical activity daily).  Educational handout given for paronychia   The above assessment and management plan was discussed with the patient. The patient verbalized understanding of and has agreed to the management plan. Patient is aware to call the clinic if they develop any new symptoms or if symptoms persist or worsen. Patient is aware when to return to the clinic for a follow-up visit. Patient educated on when it is appropriate to go to the emergency department.  Kari Baars, FNP-C [facility] [phone]

## 2023-05-09 ENCOUNTER — Encounter: Payer: Self-pay | Admitting: Family Medicine

## 2023-05-09 ENCOUNTER — Ambulatory Visit: Payer: BC Managed Care – PPO | Admitting: Family Medicine

## 2023-05-09 VITALS — BP 138/78 | HR 60 | Temp 98.5°F | Ht 61.0 in | Wt 253.6 lb

## 2023-05-09 DIAGNOSIS — E1169 Type 2 diabetes mellitus with other specified complication: Secondary | ICD-10-CM

## 2023-05-09 DIAGNOSIS — I152 Hypertension secondary to endocrine disorders: Secondary | ICD-10-CM

## 2023-05-09 DIAGNOSIS — E1142 Type 2 diabetes mellitus with diabetic polyneuropathy: Secondary | ICD-10-CM

## 2023-05-09 DIAGNOSIS — E785 Hyperlipidemia, unspecified: Secondary | ICD-10-CM | POA: Diagnosis not present

## 2023-05-09 DIAGNOSIS — B351 Tinea unguium: Secondary | ICD-10-CM | POA: Diagnosis not present

## 2023-05-09 DIAGNOSIS — E1159 Type 2 diabetes mellitus with other circulatory complications: Secondary | ICD-10-CM | POA: Diagnosis not present

## 2023-05-09 LAB — BAYER DCA HB A1C WAIVED: HB A1C (BAYER DCA - WAIVED): 7.5 % — ABNORMAL HIGH (ref 4.8–5.6)

## 2023-05-09 MED ORDER — TERBINAFINE HCL 250 MG PO TABS
250.0000 mg | ORAL_TABLET | Freq: Every day | ORAL | 0 refills | Status: AC
Start: 2023-05-09 — End: 2023-08-07

## 2023-05-09 MED ORDER — LANCET DEVICE MISC
0 refills | Status: AC
Start: 2023-05-09 — End: ?

## 2023-05-09 MED ORDER — BLOOD GLUCOSE TEST VI STRP
ORAL_STRIP | 3 refills | Status: AC
Start: 2023-05-09 — End: ?

## 2023-05-09 MED ORDER — BLOOD GLUCOSE MONITORING SUPPL DEVI
0 refills | Status: AC
Start: 2023-05-09 — End: ?

## 2023-05-09 MED ORDER — LANCETS MISC. MISC
3 refills | Status: AC
Start: 2023-05-09 — End: ?

## 2023-05-09 NOTE — Progress Notes (Signed)
Subjective: CC:DM PCP: Raliegh Ip, DO ZOX:WRUEA AZUREE TEAL is a 62 y.o. female presenting to clinic today for:  1. Type 2 Diabetes with hypertension, hyperlipidemia:  Reports not checking BGs because she didn't know how to apply the [place].  It apparently got caught on something in her car about 3 days after we put the last one on and she never did get a POC meter from the pharmacy.  Asking for repeat demonstration of CGM today.  Admits to noncompliance with diet.  Reports no chest pain, shortness of breath, blurred vision  Diabetes Health Maintenance Due  Topic Date Due   OPHTHALMOLOGY EXAM  05/29/2023   HEMOGLOBIN A1C  08/10/2023   FOOT EXAM  05/08/2024    Last A1c:  Lab Results  Component Value Date   HGBA1C 7.2 (H) 02/07/2023    2.  Toe lesion She had a soft tissue infection of the cuticle recently.  It was felt to be secondary to going to the nail salon and having her nails done.  She is status post treatment with antibiotics x 2 now and she reports resolution but it still has a little bit of erythema and she wonders if it will come back.   ROS: Per HPI  No Known Allergies Past Medical History:  Diagnosis Date   Diabetes mellitus without complication (HCC)    Hyperlipidemia    Hypertension    Neuropathy    Obesity    Vitamin D deficiency disease     Current Outpatient Medications:    Accu-Chek Softclix Lancets lancets, Use as instructed, Disp: 100 each, Rfl: 12   amLODipine-olmesartan (AZOR) 5-40 MG tablet, Take 1 tablet by mouth daily., Disp: 90 tablet, Rfl: 3   aspirin 81 MG chewable tablet, Chew 81 mg by mouth daily., Disp: , Rfl:    atorvastatin (LIPITOR) 40 MG tablet, Take 1 tablet (40 mg total) by mouth daily., Disp: 90 tablet, Rfl: 3   betamethasone, augmented, (DIPROLENE) 0.05 % lotion, APPLY TO AFFECTED AREA TWICE A DAY, Disp: 60 mL, Rfl: 2   blood glucose meter kit and supplies, Dispense based on patient and insurance preference (wants freestyle).  Use up to one time daily as directed. (FOR ICD-10 E10.9, E11.9)., Disp: 1 each, Rfl: 0   cholecalciferol (VITAMIN D) 1000 UNITS tablet, Take 2,000 Units by mouth daily., Disp: , Rfl:    clobetasol ointment (TEMOVATE) 0.05 %, Apply topically daily., Disp: , Rfl:    Continuous Blood Gluc Receiver (FREESTYLE LIBRE 2 READER) DEVI, 1 EACH BY DOES NOT APPLY ROUTE, Disp: 1 each, Rfl: 0   Continuous Blood Gluc Sensor (FREESTYLE LIBRE 2 SENSOR) MISC, Use as directed every 14 days. E11.9, Disp: 2 each, Rfl: 12   fluticasone (FLONASE) 50 MCG/ACT nasal spray, Place 2 sprays into both nostrils daily., Disp: 16 g, Rfl: 6   folic acid (FOLVITE) 1 MG tablet, Take 1 mg by mouth daily., Disp: , Rfl:    furosemide (LASIX) 20 MG tablet, Take 1 tablet (20 mg total) by mouth daily., Disp: 90 tablet, Rfl: 3   glipiZIDE (GLUCOTROL XL) 10 MG 24 hr tablet, Take 1 tablet (10 mg total) by mouth daily with breakfast. For diabetes, Disp: 90 tablet, Rfl: 3   glucose blood (ACCU-CHEK GUIDE) test strip, USE TEST BLOOD SUGAR DAILY. DX: E11.9, Disp: 100 each, Rfl: 12   guaiFENesin (MUCINEX) 600 MG 12 hr tablet, Take 1 tablet (600 mg total) by mouth 2 (two) times daily., Disp: 30 tablet, Rfl: 0  Lancet Device MISC, Use to test BG once daily, Disp: 100 each, Rfl: 3   SitaGLIPtin-MetFORMIN HCl (JANUMET XR) [phone] MG TB24, Take 1 tablet by mouth daily., Disp: 90 tablet, Rfl: 3   triamcinolone cream (KENALOG) 0.1 %, Apply 1 application topically 2 (two) times daily. X10 days, Disp: 60 g, Rfl: 0   Vitamin D, Ergocalciferol, (DRISDOL) 1.25 MG (50000 UNIT) CAPS capsule, TAKE 1 CAPSULE (50,000 UNITS TOTAL) BY MOUTH EVERY 7 (SEVEN) DAYS, Disp: 12 capsule, Rfl: 1 Social History   Socioeconomic History   Marital status: Married    Spouse name: Not on file   Number of children: Not on file   Years of education: Not on file   Highest education level: Not on file  Occupational History   Not on file  Tobacco Use   Smoking status: Never    Smokeless tobacco: Never  Vaping Use   Vaping status: Never Used  Substance and Sexual Activity   Alcohol use: Never   Drug use: Never   Sexual activity: Not on file  Other Topics Concern   Not on file  Social History Narrative   Not on file   Social Determinants of Health   Financial Resource Strain: Not on file  Food Insecurity: Not on file  Transportation Needs: Not on file  Physical Activity: Not on file  Stress: Not on file  Social Connections: Unknown (02/01/2022)   Received from [facility]   Social Network    Social Network: Not on file  Intimate Partner Violence: Unknown (12/25/2021)   Received from [facility]   HITS    Physically Hurt: Not on file    Insult or Talk Down To: Not on file    Threaten Physical Harm: Not on file    Scream or Curse: Not on file   Family History  Problem Relation Age of Onset   Heart attack Mother    Cancer Father    CVA Maternal Grandmother    Heart attack Maternal Grandfather    Cancer Sister    Early death Brother    Heart attack Sister    Healthy Sister     Objective: Office vital signs reviewed. BP 138/78   Pulse 60   Temp 98.5 F (36.9 C)   Ht 5\' 1"  (1.549 m)   Wt 253 lb 9.6 oz (115 kg)   SpO2 95%   BMI 47.92 kg/m   Physical Examination:  General: Awake, alert, morbidly obese, No acute distress HEENT: sclera white, MMM Cardio: regular rate and rhythm, S1S2 heard, 2/6 systolic murmurs appreciated Pulm: clear to auscultation bilaterally, no wheezes, rhonchi or rales; normal work of breathing on room air Diabetic Foot Exam - Simple   Simple Foot Form Diabetic Foot exam was performed with the following findings: Yes 05/09/2023 12:45 PM  Visual Inspection See comments: Yes Sensation Testing Intact to touch and monofilament testing bilaterally: Yes Pulse Check Posterior Tibialis and Dorsalis pulse intact bilaterally: Yes Comments Onychomycotic changes to the nails and the skin bilaterally.  Plantar aspects  of the feet are flaky.  No evidence of bacterial infection of the cuticle today.      Assessment/ Plan: 62 y.o. female   Type 2 diabetes mellitus with diabetic polyneuropathy, unspecified whether long term insulin use (HCC) - Plan: CBC with Differential/Platelet, CMP14+EGFR, Lipid panel, Bayer DCA Hb A1c Waived, Blood Glucose Monitoring Suppl DEVI, Glucose Blood (BLOOD GLUCOSE TEST STRIPS) STRP, Lancet Device MISC, Lancets Misc. MISC, Ambulatory referral to Podiatry  Hyperlipidemia associated  with type 2 diabetes mellitus (HCC) - Plan: CBC with Differential/Platelet, CMP14+EGFR, Lipid panel, Bayer DCA Hb A1c Waived  Hypertension associated with diabetes (HCC) - Plan: CBC with Differential/Platelet, CMP14+EGFR, Lipid panel, Bayer DCA Hb A1c Waived  Onychomycosis - Plan: terbinafine (LAMISIL) 250 MG tablet, Hepatic function panel, Ambulatory referral to Podiatry   Sugar not at goal with A1c rising to 7.5.  She has committed to lifestyle modification today and was demonstrated how to apply her CGM again.  Encouraged frequent blood sugar monitoring but I did go ahead and send a point-of-care meter and again to the pharmacy to ensure that she is managing her blood sugars 1 where the other.  She will continue all current regimen for now but we may need to revisit alternative therapies if she is not able to get her A1c below 7 with lifestyle modification and frequent monitoring.  I have also referred her to podiatry for ongoing nail care and agree with avoidance of nail salons given her type 2 diabetes and history of soft tissue infection of the feet  She will continue her statin as prescribed.  Blood pressure was controlled so she will continue that regimen as well.  Lamisil ordered.  She will get liver functions rechecked in 6 weeks and lab appointment to be scheduled at discharge.  Referral to podiatry for other management related to her feet  Shereta Crothers Hulen Skains, DO [facility] [phone]

## 2023-05-10 LAB — CMP14+EGFR
ALT: 22 IU/L (ref 0–32)
AST: 17 IU/L (ref 0–40)
Albumin: 4.1 g/dL (ref 3.9–4.9)
Alkaline Phosphatase: 96 IU/L (ref 44–121)
BUN/Creatinine Ratio: 18 (ref 12–28)
BUN: 10 mg/dL (ref 8–27)
Bilirubin Total: 0.6 mg/dL (ref 0.0–1.2)
CO2: 25 mmol/L (ref 20–29)
Calcium: 9.5 mg/dL (ref 8.7–10.3)
Chloride: 102 mmol/L (ref 96–106)
Creatinine, Ser: 0.55 mg/dL — ABNORMAL LOW (ref 0.57–1.00)
Globulin, Total: 2.2 g/dL (ref 1.5–4.5)
Glucose: 158 mg/dL — ABNORMAL HIGH (ref 70–99)
Potassium: 4.2 mmol/L (ref 3.5–5.2)
Sodium: 140 mmol/L (ref 134–144)
Total Protein: 6.3 g/dL (ref 6.0–8.5)
eGFR: 103 mL/min/{1.73_m2} (ref >59)

## 2023-05-10 LAB — CBC WITH DIFFERENTIAL/PLATELET
Basophils Absolute: 0.1 10*3/uL (ref 0.0–0.2)
Basos: 1 %
EOS (ABSOLUTE): 0.2 10*3/uL (ref 0.0–0.4)
Eos: 2 %
Hematocrit: 40.9 % (ref 34.0–46.6)
Hemoglobin: 13.1 g/dL (ref 11.1–15.9)
Immature Grans (Abs): 0 10*3/uL (ref 0.0–0.1)
Immature Granulocytes: 0 %
Lymphocytes Absolute: 2 10*3/uL (ref 0.7–3.1)
Lymphs: 22 %
MCH: 27.2 pg (ref 26.6–33.0)
MCHC: 32 g/dL (ref 31.5–35.7)
MCV: 85 fL (ref 79–97)
Monocytes Absolute: 0.6 10*3/uL (ref 0.1–0.9)
Monocytes: 6 %
Neutrophils Absolute: 6.1 10*3/uL (ref 1.4–7.0)
Neutrophils: 69 %
Platelets: 235 10*3/uL (ref 150–450)
RBC: 4.82 x10E6/uL (ref 3.77–5.28)
RDW: 14.4 % (ref 11.7–15.4)
WBC: 8.9 10*3/uL (ref 3.4–10.8)

## 2023-05-10 LAB — LIPID PANEL
Chol/HDL Ratio: 3.7 ratio (ref 0.0–4.4)
Cholesterol, Total: 121 mg/dL (ref 100–199)
HDL: 33 mg/dL — ABNORMAL LOW (ref >39)
LDL Chol Calc (NIH): 57 mg/dL (ref 0–99)
Triglycerides: 185 mg/dL — ABNORMAL HIGH (ref 0–149)
VLDL Cholesterol Cal: 31 mg/dL (ref 5–40)

## 2023-05-27 NOTE — Telephone Encounter (Signed)
Several attempts have been made to contact patient to get her started on prolia with no call back.

## 2023-06-02 LAB — HM DIABETES EYE EXAM

## 2023-06-20 ENCOUNTER — Other Ambulatory Visit: Payer: BC Managed Care – PPO

## 2023-07-11 DIAGNOSIS — L900 Lichen sclerosus et atrophicus: Secondary | ICD-10-CM | POA: Diagnosis not present

## 2023-07-11 DIAGNOSIS — L304 Erythema intertrigo: Secondary | ICD-10-CM | POA: Diagnosis not present

## 2023-07-29 ENCOUNTER — Telehealth: Payer: Self-pay | Admitting: Family Medicine

## 2023-08-06 DIAGNOSIS — R92323 Mammographic fibroglandular density, bilateral breasts: Secondary | ICD-10-CM | POA: Diagnosis not present

## 2023-08-06 DIAGNOSIS — Z1231 Encounter for screening mammogram for malignant neoplasm of breast: Secondary | ICD-10-CM | POA: Diagnosis not present

## 2023-08-06 LAB — HM MAMMOGRAPHY

## 2023-08-11 ENCOUNTER — Telehealth: Payer: Self-pay | Admitting: Family Medicine

## 2023-08-12 ENCOUNTER — Ambulatory Visit: Payer: Self-pay | Admitting: Family Medicine

## 2023-08-12 NOTE — Telephone Encounter (Signed)
  Chief Complaint: L 2nd toe Symptoms: redness Frequency: 2 days Pertinent Negatives: Patient denies fever, denies trauma/injury, denies abnormal BGM Disposition: [] ED /[] Urgent Care (no appt availability in office) / [x] Appointment(In office/virtual)/ []  [facility]/ [] Home Care/ [] Refused Recommended Disposition /[] [facility]/ []  Follow-up with PCP Additional Notes: Pt calls with redness to 2 L toe.  Hx of DM II, sugars controlled at this time per pt, most recent sugar-95 today.  Pt denies fever, denies injury/trauma to digit. States has had infection on this toe before. Pt soaking foot in Epsom.  Pt scheduled for tomorrow at 1600 with [facility] FNP, advised to seek ED treatment if pt develops fever or worsening s/s before appt.   Reason for Disposition  [1] Swollen toe AND [2] no fever  (Exceptions: Just a localized bump from bunion, corns, insect bite, sting.)  Answer Assessment - Initial Assessment Questions 1. ONSET: "When did the pain start?"      2 days ago 2. LOCATION: "Where is the pain located?"   (e.g., around nail, entire toe, at foot joint)      L 2nd toe 3. PAIN: "How bad is the pain?"    (Scale 1-10; or mild, moderate, severe)   -  MILD (1-3): doesn't interfere with normal activities    -  MODERATE (4-7): interferes with normal activities (e.g., work or school) or awakens from sleep, limping    -  SEVERE (8-10): excruciating pain, unable to do any normal activities, unable to walk     5 4. APPEARANCE: "What does the toe look like?" (e.g., redness, swelling, bruising, pallor)     Redness, minimal swelling 5. CAUSE: "What do you think is causing the toe pain?"     infection 6. OTHER SYMPTOMS: "Do you have any other symptoms?" (e.g., leg pain, rash, fever, numbness)     denies  Protocols used: Toe Pain-A-AH

## 2023-08-12 NOTE — Telephone Encounter (Signed)
Attempted to call this morning at 0940 (0840), no answer, left a voicemail. Scheduled to reach back out and route to call back pool.

## 2023-08-12 NOTE — Telephone Encounter (Signed)
Attempted to call patient, no answer, voicemail box is full unable to leave message. Scheduled for another call back attempt.

## 2023-08-12 NOTE — Telephone Encounter (Signed)
  Chief Complaint: Medication Refill Request  Disposition: [] ED /[] Urgent Care (no appt availability in office) / [] Appointment(In office/virtual)/ []  [facility]/ [] Home Care/ [] Refused Recommended Disposition /[] [facility]/ [x]  Follow-up with PCP Additional Notes: Patient is requesting Terbinafine 250 mg due to her toe nail infection being ongoing and the prescriber only provided a 90 day supply a few months back.

## 2023-08-12 NOTE — Telephone Encounter (Signed)
As we discussed during that visit, the Lamisil is ONLY intended for 3 month supply as it will stay in the fat cells of the body for up to 16 months.  She was supposed to come in for LIVER function tests and I do not see where she ever did that, though future orders are in.  If she is still having issues with NEW fungal changes at the base of the nails, recommend eval with podiatry as this would be considered a failure of treatment

## 2023-08-12 NOTE — Telephone Encounter (Signed)
Copied from CRM [id number]. Topic: Clinical - Medication Question >> Aug 12, 2023  3:39 PM Raven B wrote: Reason for CRM: PT following up regarding Terbinafine 250MG  prescription refill request. Unable to answer incoming calls on mobile phone please call PT on home phone # [phone]

## 2023-08-12 NOTE — Telephone Encounter (Addendum)
Attempted to call patient, no answer, voicemail box is full unable to leave message. Scheduled for another call back attempt.

## 2023-08-13 ENCOUNTER — Encounter: Payer: Self-pay | Admitting: Family Medicine

## 2023-08-13 ENCOUNTER — Ambulatory Visit: Payer: BC Managed Care – PPO | Admitting: Family Medicine

## 2023-08-13 VITALS — BP 157/90 | HR 82 | Temp 97.2°F | Ht 61.0 in | Wt 255.0 lb

## 2023-08-13 DIAGNOSIS — L03032 Cellulitis of left toe: Secondary | ICD-10-CM

## 2023-08-13 DIAGNOSIS — L600 Ingrowing nail: Secondary | ICD-10-CM | POA: Diagnosis not present

## 2023-08-13 DIAGNOSIS — B351 Tinea unguium: Secondary | ICD-10-CM | POA: Diagnosis not present

## 2023-08-13 DIAGNOSIS — E1142 Type 2 diabetes mellitus with diabetic polyneuropathy: Secondary | ICD-10-CM

## 2023-08-13 DIAGNOSIS — I152 Hypertension secondary to endocrine disorders: Secondary | ICD-10-CM

## 2023-08-13 DIAGNOSIS — E1159 Type 2 diabetes mellitus with other circulatory complications: Secondary | ICD-10-CM

## 2023-08-13 MED ORDER — DOXYCYCLINE HYCLATE 100 MG PO TABS: 100.0000 mg | ORAL_TABLET | Freq: Two times a day (BID) | ORAL | 0 refills | Status: AC

## 2023-08-13 NOTE — Progress Notes (Signed)
   Acute Office Visit  Subjective:     Patient ID: Carla Mason, female    DOB: 1961-07-02, 62 y.o.   MRN: [id number]  Chief Complaint  Patient presents with   Toe Pain    HPI Patient is in today for infection in her left 2nd toe. She has had an ingrown nail her nail tech noticed with her last pedicure. She reports erythema and tenderness for the last 5 days. No exudate, fever, or chills. She has tried epsom salt soaks without improvement. She has completed Lamisil for 90 days for fungal nail infection. She believes that the nail growth looks clear but isn't sure. She has not had a recent follow up with podiatry.  Hasn't been checking BP at home recently. Compliant with medications. Denies symptoms.   ROS As per HPI.      Objective:    BP (!) 157/90   Pulse 82   Temp (!) 97.2 F (36.2 C) (Temporal)   Ht 5\' 1"  (1.549 m)   Wt 255 lb (115.7 kg)   SpO2 94%   BMI 48.18 kg/m  BP Readings from Last 3 Encounters:  08/13/23 (!) 157/90  05/09/23 138/78  04/25/23 (!) 147/76      Physical Exam Vitals and nursing note reviewed.  Constitutional:      General: She is not in acute distress.    Appearance: She is not ill-appearing, toxic-appearing or diaphoretic.  Cardiovascular:     Rate and Rhythm: Normal rate and regular rhythm.     Heart sounds: Normal heart sounds. No murmur heard. Pulmonary:     Effort: Pulmonary effort is normal.     Breath sounds: Normal breath sounds.  Musculoskeletal:     Right lower leg: No edema.     Left lower leg: No edema.  Feet:     Comments: Ingrown toenail of left 2nd toe. Erythema, swelling, and warmth present. Scant purulent exudate. Nails painted. ROM intact and sensation intact. Brisk cap refill.  Skin:    General: Skin is warm and dry.  Neurological:     General: No focal deficit present.     Mental Status: She is alert and oriented to person, place, and time.  Psychiatric:        Mood and Affect: Mood normal.        Behavior:  Behavior normal.     No results found for any visits on 08/13/23.      Assessment & Plan:   Carla Mason was seen today for toe pain.  Diagnoses and all orders for this visit:  Ingrown toenail of left foot Paronychia of second toe of left foot Doxycyline as below. Referral back to podiatry. Epsom salt soaks.  -     Ambulatory referral to Podiatry -     doxycycline (VIBRA-TABS) 100 MG tablet; Take 1 tablet (100 mg total) by mouth 2 (two) times daily for 7 days.  Onychomycosis -     Ambulatory referral to Podiatry  Type 2 diabetes mellitus with diabetic polyneuropathy, unspecified whether long term insulin use (HCC) -     Ambulatory referral to Podiatry  Hypertension associated with diabetes (HCC) BP not at goal. Monitor at home and notify PCP for elevated readings.   Return if symptoms worsen or fail to improve.  The patient indicates understanding of these issues and agrees with the plan.  Gabriel Earing, FNP

## 2023-08-14 NOTE — Telephone Encounter (Signed)
Attempted to call pt no answer  

## 2023-08-18 ENCOUNTER — Encounter: Payer: Self-pay | Admitting: Family Medicine

## 2023-09-02 ENCOUNTER — Ambulatory Visit: Payer: BC Managed Care – PPO | Admitting: Family Medicine

## 2023-09-02 ENCOUNTER — Encounter: Payer: Self-pay | Admitting: Family Medicine

## 2023-09-02 VITALS — BP 118/83 | HR 81 | Temp 98.3°F | Ht 61.0 in | Wt 252.0 lb

## 2023-09-02 DIAGNOSIS — B351 Tinea unguium: Secondary | ICD-10-CM

## 2023-09-02 DIAGNOSIS — E119 Type 2 diabetes mellitus without complications: Secondary | ICD-10-CM | POA: Diagnosis not present

## 2023-09-02 DIAGNOSIS — E1169 Type 2 diabetes mellitus with other specified complication: Secondary | ICD-10-CM

## 2023-09-02 DIAGNOSIS — E785 Hyperlipidemia, unspecified: Secondary | ICD-10-CM

## 2023-09-02 DIAGNOSIS — Z7984 Long term (current) use of oral hypoglycemic drugs: Secondary | ICD-10-CM

## 2023-09-02 DIAGNOSIS — E1159 Type 2 diabetes mellitus with other circulatory complications: Secondary | ICD-10-CM | POA: Diagnosis not present

## 2023-09-02 DIAGNOSIS — R600 Localized edema: Secondary | ICD-10-CM

## 2023-09-02 DIAGNOSIS — Z23 Encounter for immunization: Secondary | ICD-10-CM

## 2023-09-02 DIAGNOSIS — I152 Hypertension secondary to endocrine disorders: Secondary | ICD-10-CM

## 2023-09-02 LAB — BAYER DCA HB A1C WAIVED: HB A1C (BAYER DCA - WAIVED): 6.2 % — ABNORMAL HIGH (ref 4.8–5.6)

## 2023-09-02 MED ORDER — AMLODIPINE-OLMESARTAN 5-40 MG PO TABS: 1.0000 | ORAL_TABLET | Freq: Every day | ORAL | 3 refills | Status: DC

## 2023-09-02 MED ORDER — GLIPIZIDE ER 10 MG PO TB24: 10.0000 mg | ORAL_TABLET | Freq: Every day | ORAL | 3 refills | Status: DC

## 2023-09-02 MED ORDER — CICLOPIROX 8 % EX SOLN: Freq: Every day | CUTANEOUS | 0 refills | Status: DC

## 2023-09-02 MED ORDER — JANUMET XR 100-1000 MG PO TB24: 1.0000 | ORAL_TABLET | Freq: Every day | ORAL | 3 refills | Status: DC

## 2023-09-02 MED ORDER — FUROSEMIDE 20 MG PO TABS: 20.0000 mg | ORAL_TABLET | Freq: Every day | ORAL | 3 refills | Status: DC

## 2023-09-02 MED ORDER — ATORVASTATIN CALCIUM 40 MG PO TABS: 40.0000 mg | ORAL_TABLET | Freq: Every day | ORAL | 3 refills | Status: DC

## 2023-09-02 NOTE — Progress Notes (Signed)
Subjective: CC:DM PCP: Raliegh Ip, DO ZOX:WRUEA Carla Mason is a 62 y.o. female presenting to clinic today for:  1. Type 2 Diabetes with hypertension, hyperlipidemia:  She reports compliance with medications.  She is now remembering to take her glipizide and this seems that made a difference for her.  She is not exercising regularly but is trying to watch her diet some.  She continues to use a CGM, the freestyle libre 2, to monitor blood sugars and she is in target about 90% and above target about 9%.  She had 1 measured hypoglycemic episode over the last month and this was in the middle the night.  She does not recall being symptomatic.  Currently utilizing the CGM on her right lower abdomen as it kept falling off the back of her arms.  She has seen her eye doctor here in [place].  Sees my eye doctor.  Had retinal exam.  Flu shot given at work.  Needs tetanus today. Diabetes Health Maintenance Due  Topic Date Due   OPHTHALMOLOGY EXAM  09/02/2023 (Originally 05/29/2023)   HEMOGLOBIN A1C  11/09/2023   FOOT EXAM  09/01/2024    Last A1c:  Lab Results  Component Value Date   HGBA1C 7.5 (H) 05/09/2023    ROS: Denies dizziness, LOC, polyuria, polydipsia, unintended weight loss/gain, foot ulcerations, numbness or tingling in extremities, shortness of breath or chest pain.   ROS: Per HPI  No Known Allergies Past Medical History:  Diagnosis Date   Diabetes mellitus without complication (HCC)    Hyperlipidemia    Hypertension    Neuropathy    Obesity    Vitamin D deficiency disease     Current Outpatient Medications:    Accu-Chek Softclix Lancets lancets, Use as instructed, Disp: 100 each, Rfl: 12   amLODipine-olmesartan (AZOR) 5-40 MG tablet, Take 1 tablet by mouth daily., Disp: 90 tablet, Rfl: 3   aspirin 81 MG chewable tablet, Chew 81 mg by mouth daily., Disp: , Rfl:    atorvastatin (LIPITOR) 40 MG tablet, Take 1 tablet (40 mg total) by mouth daily., Disp: 90 tablet, Rfl:  3   betamethasone, augmented, (DIPROLENE) 0.05 % lotion, APPLY TO AFFECTED AREA TWICE A DAY, Disp: 60 mL, Rfl: 2   blood glucose meter kit and supplies, Dispense based on patient and insurance preference (wants freestyle). Use up to one time daily as directed. (FOR ICD-10 E10.9, E11.9)., Disp: 1 each, Rfl: 0   Blood Glucose Monitoring Suppl DEVI, Check BGs daily E11.42. May substitute to any manufacturer covered by patient's insurance., Disp: 1 each, Rfl: 0   cholecalciferol (VITAMIN D) 1000 UNITS tablet, Take 2,000 Units by mouth daily., Disp: , Rfl:    clobetasol ointment (TEMOVATE) 0.05 %, Apply topically daily., Disp: , Rfl:    Continuous Blood Gluc Receiver (FREESTYLE LIBRE 2 READER) DEVI, 1 EACH BY DOES NOT APPLY ROUTE, Disp: 1 each, Rfl: 0   Continuous Blood Gluc Sensor (FREESTYLE LIBRE 2 SENSOR) MISC, Use as directed every 14 days. E11.9, Disp: 2 each, Rfl: 12   fluticasone (FLONASE) 50 MCG/ACT nasal spray, Place 2 sprays into both nostrils daily., Disp: 16 g, Rfl: 6   folic acid (FOLVITE) 1 MG tablet, Take 1 mg by mouth daily., Disp: , Rfl:    furosemide (LASIX) 20 MG tablet, Take 1 tablet (20 mg total) by mouth daily., Disp: 90 tablet, Rfl: 3   glipiZIDE (GLUCOTROL XL) 10 MG 24 hr tablet, Take 1 tablet (10 mg total) by mouth daily with  breakfast. For diabetes, Disp: 90 tablet, Rfl: 3   Glucose Blood (BLOOD GLUCOSE TEST STRIPS) STRP, Check BGs daily E11.42.May substitute to any manufacturer covered by patient's insurance., Disp: 100 strip, Rfl: 3   guaiFENesin (MUCINEX) 600 MG 12 hr tablet, Take 1 tablet (600 mg total) by mouth 2 (two) times daily., Disp: 30 tablet, Rfl: 0   Lancet Device MISC, Check BGs daily E11.42.May substitute to any manufacturer covered by patient's insurance., Disp: 1 each, Rfl: 0   Lancets Misc. MISC, Check BGs daily E11.42.May substitute to any manufacturer covered by patient's insurance., Disp: 100 each, Rfl: 3   SitaGLIPtin-MetFORMIN HCl (JANUMET XR) [phone] MG  TB24, Take 1 tablet by mouth daily., Disp: 90 tablet, Rfl: 3   triamcinolone cream (KENALOG) 0.1 %, Apply 1 application topically 2 (two) times daily. X10 days, Disp: 60 g, Rfl: 0   Vitamin D, Ergocalciferol, (DRISDOL) 1.25 MG (50000 UNIT) CAPS capsule, TAKE 1 CAPSULE (50,000 UNITS TOTAL) BY MOUTH EVERY 7 (SEVEN) DAYS, Disp: 12 capsule, Rfl: 1 Social History   Socioeconomic History   Marital status: Married    Spouse name: Not on file   Number of children: Not on file   Years of education: Not on file   Highest education level: Not on file  Occupational History   Not on file  Tobacco Use   Smoking status: Never   Smokeless tobacco: Never  Vaping Use   Vaping status: Never Used  Substance and Sexual Activity   Alcohol use: Never   Drug use: Never   Sexual activity: Not on file  Other Topics Concern   Not on file  Social History Narrative   Not on file   Social Determinants of Health   Financial Resource Strain: Not on file  Food Insecurity: Not on file  Transportation Needs: Not on file  Physical Activity: Not on file  Stress: Not on file  Social Connections: Unknown (02/01/2022)   Received from [facility]   Social Network    Social Network: Not on file  Intimate Partner Violence: Unknown (12/25/2021)   Received from [facility]   HITS    Physically Hurt: Not on file    Insult or Talk Down To: Not on file    Threaten Physical Harm: Not on file    Scream or Curse: Not on file   Family History  Problem Relation Age of Onset   Heart attack Mother    Cancer Father    CVA Maternal Grandmother    Heart attack Maternal Grandfather    Cancer Sister    Early death Brother    Heart attack Sister    Healthy Sister     Objective: Office vital signs reviewed. BP 118/83   Pulse 81   Temp 98.3 F (36.8 C)   Ht 5\' 1"  (1.549 m)   Wt 252 lb (114.3 kg)   BMI 47.61 kg/m   Physical Examination:  General: Awake, alert, morbidly obese, No  acute distress HEENT: sclera white, MMM Cardio: regular rate and rhythm, S1S2 heard, no murmurs appreciated Pulm: clear to auscultation bilaterally, no wheezes, rhonchi or rales; normal work of breathing on room air MSK: Antalgic gait when ambulating independently  Diabetic Foot Exam - Simple   Simple Foot Form Diabetic Foot exam was performed with the following findings: Yes 09/02/2023  2:52 PM  Visual Inspection See comments: Yes Sensation Testing Intact to touch and monofilament testing bilaterally: Yes Pulse Check Posterior Tibialis and Dorsalis pulse  intact bilaterally: Yes Comments She has a broken and splintered onychomycotic toenail of the right great toe.  There is no active bleeding or soft tissue swelling or pain.      Assessment/ Plan: 62 y.o. female   Diabetes mellitus treated with oral medication (HCC) - Plan: Bayer DCA Hb A1c Waived, glipiZIDE (GLUCOTROL XL) 10 MG 24 hr tablet, SitaGLIPtin-MetFORMIN HCl (JANUMET XR) [phone] MG TB24  Hyperlipidemia associated with type 2 diabetes mellitus (HCC) - Plan: atorvastatin (LIPITOR) 40 MG tablet  Hypertension associated with diabetes (HCC) - Plan: amLODipine-olmesartan (AZOR) 5-40 MG tablet  Onychomycosis - Plan: ciclopirox (PENLAC) 8 % solution  Peripheral edema - Plan: furosemide (LASIX) 20 MG tablet  A1c now at goal with sugar dropping to 6.2.  She will continue current regimen.  Refill sent.  Not yet due for fasting lipid.  Continue statin.  Blood pressure controlled upon recheck.  Continue current medications.  Refill sent  Status post treatment with 3 months of Lamisil for onychomycosis.  Penlac solution ordered for maintenance.  Discussed need for sanitization of all shoes, carpets, floors etc.  Did not discuss peripheral edema but none observed on exam.  Lasix renewed for as needed use  Tetanus shot administered.  ROI for diabetic eye exam sent   Raliegh Ip, DO [facility] [phone]

## 2023-09-11 ENCOUNTER — Other Ambulatory Visit: Payer: Self-pay | Admitting: Family Medicine

## 2023-09-11 DIAGNOSIS — E559 Vitamin D deficiency, unspecified: Secondary | ICD-10-CM

## 2023-09-11 DIAGNOSIS — M810 Age-related osteoporosis without current pathological fracture: Secondary | ICD-10-CM

## 2023-09-12 NOTE — Addendum Note (Signed)
Addended by: Julious Payer D on: 09/12/2023 10:27 AM   Modules accepted: Orders

## 2023-09-12 NOTE — Telephone Encounter (Signed)
LMOVM to call office to make a LAB ONLY appt for bloodwork, order placed

## 2023-09-12 NOTE — Telephone Encounter (Signed)
Needs to have vit d drawn. Please order and schedule lab appt.

## 2023-09-22 ENCOUNTER — Telehealth: Payer: Self-pay

## 2023-09-22 NOTE — Telephone Encounter (Signed)
Copied from CRM [id number]. Topic: General - Other >> Sep 22, 2023  8:16 AM Geroge Baseman wrote: Reason for CRM: Patient received a call about labs but is unsure what they are for would like office to [facility] with her what the labs are for and when she can come in

## 2023-09-22 NOTE — Telephone Encounter (Signed)
lmtcb

## 2023-09-23 NOTE — Telephone Encounter (Signed)
Patient has been seen since this phone encounter.

## 2023-09-30 ENCOUNTER — Encounter: Payer: Self-pay | Admitting: Nurse Practitioner

## 2023-09-30 ENCOUNTER — Ambulatory Visit: Payer: BC Managed Care – PPO | Admitting: Nurse Practitioner

## 2023-09-30 VITALS — BP 117/77 | HR 82 | Temp 97.9°F | Ht 61.0 in | Wt 250.6 lb

## 2023-09-30 DIAGNOSIS — J069 Acute upper respiratory infection, unspecified: Secondary | ICD-10-CM | POA: Diagnosis not present

## 2023-09-30 DIAGNOSIS — M810 Age-related osteoporosis without current pathological fracture: Secondary | ICD-10-CM

## 2023-09-30 DIAGNOSIS — E559 Vitamin D deficiency, unspecified: Secondary | ICD-10-CM | POA: Diagnosis not present

## 2023-09-30 DIAGNOSIS — R0981 Nasal congestion: Secondary | ICD-10-CM

## 2023-09-30 DIAGNOSIS — R051 Acute cough: Secondary | ICD-10-CM

## 2023-09-30 LAB — VERITOR FLU A/B WAIVED
Influenza A: NEGATIVE
Influenza B: NEGATIVE

## 2023-09-30 MED ORDER — BENZONATATE 100 MG PO CAPS: 100.0000 mg | ORAL_CAPSULE | Freq: Three times a day (TID) | ORAL | 0 refills | Status: DC | PRN

## 2023-09-30 MED ORDER — FLUTICASONE PROPIONATE 50 MCG/ACT NA SUSP: 2.0000 | Freq: Every day | NASAL | 0 refills | Status: DC

## 2023-09-30 NOTE — Progress Notes (Signed)
 Acute Office Visit  Subjective:     Patient ID: Carla Mason, female    DOB: Jan 21, 1961, 63 y.o.   MRN: [id number]  Chief Complaint  Patient presents with   Cough    Symptoms started Saturday. Taking mucinex  but not helping   Nasal Congestion    HPI BENTLI Carla Mason is a 63 y.o. female presents 09/30/2023 who complains of congestion, cough described as productive of clear sputum, and clear nasal discharge for 3 days. She denies a history of anorexia, chest pain, fevers, myalgias, nausea, shortness of breath, vomiting, and wheezing and denies a history of asthma. Patient denies smoke cigarettes.   Active Ambulatory Problems    Diagnosis Date Noted   Diabetes mellitus treated with oral medication (HCC) 01/04/2013   Neuropathy 01/04/2013   Eczema, dyshidrotic 01/04/2013   Hyperlipidemia associated with type 2 diabetes mellitus (HCC)    Vitamin D  deficiency disease    Peripheral edema 10/21/2014   Severe obesity (BMI >= 40) (HCC) 02/03/2015   Seborrheic keratosis 01/12/2016   Aortic valve sclerosis 05/01/2018   Primary osteoarthritis of both knees 11/13/2018   Hypertension associated with diabetes (HCC) 05/06/2022   History of partial hysterectomy 11/08/2022   Resolved Ambulatory Problems    Diagnosis Date Noted   Other and unspecified hyperlipidemia 01/04/2013   Essential hypertension, benign 01/04/2013   Edema 05/19/2015   Skin lesion 01/12/2016   Healthcare maintenance 05/03/2016   Past Medical History:  Diagnosis Date   Diabetes mellitus without complication (HCC)    Hyperlipidemia    Hypertension    Obesity      ROS Negative unless indicated in HPI    Objective:    BP 117/77   Pulse 82   Temp 97.9 F (36.6 C) (Temporal)   Ht 5' 1 (1.549 m)   Wt 250 lb 9.6 oz (113.7 kg)   SpO2 94%   BMI 47.35 kg/m  BP Readings from Last 3 Encounters:  09/30/23 117/77  09/02/23 118/83  08/13/23 (!) 157/90   Wt Readings from Last 3 Encounters:  09/30/23 250 lb 9.6  oz (113.7 kg)  09/02/23 252 lb (114.3 kg)  08/13/23 255 lb (115.7 kg)   Physical Exam She appears well, vital signs are as noted. Ears normal.  Throat and pharynx normal.  Neck supple. No adenopathy in the neck. Nose is congested. Sinuses non tender. The chest is clear, without wheezes or rales. Has been  taking Mucinex    POC Flu negative; COVID pending results Results for orders placed or performed in visit on 09/30/23  Veritor Flu A/B Waived  Result Value Ref Range   Influenza A Negative Negative   Influenza B Negative Negative        Assessment & Plan:  Acute cough -     Novel Coronavirus, NAA ([facility]) -     Veritor Flu A/B Waived -     Benzonatate ; Take 1 capsule (100 mg total) by mouth 3 (three) times daily as needed.  Dispense: 30 capsule; Refill: 0  Nasal congestion -     Novel Coronavirus, NAA ([facility]) -     Veritor Flu A/B Waived -     Fluticasone  Propionate; Place 2 sprays into both nostrils daily.  Dispense: 16 g; Refill: 0  URI with cough and congestion -     Fluticasone  Propionate; Place 2 sprays into both nostrils daily.  Dispense: 16 g; Refill: 0  Age-related osteoporosis without current pathological fracture -     VITAMIN D  25  Hydroxy (Vit-D Deficiency, Fractures)  Vitamin D  deficiency disease -     VITAMIN D  25 Hydroxy (Vit-D Deficiency, Fractures)   Carla Mason 63 year old Caucasian female seen today for URI, no acute distress Nasal congestion: Flonase  2 sprays on each nostril daily Acute cough: Tessalon  Perles 100 mg 1 tab 3 times daily as needed, client instructed to take pill with at least 8 ounces of water Increase hydration Symptomatic therapy suggested: rest and return office visit prn if symptoms persist or worsen. Lack of antibiotic effectiveness discussed with her. Call or return to clinic prn if these symptoms worsen or fail to improve as anticipated.   Encouragevhealthy lifestyle choices, including diet (rich in fruits, vegetables, and lean  proteins, and low in salt and simple carbohydrates) and exercise (at least 30 minutes of moderate physical activity daily).     The above assessment and management plan was discussed with the patient. The patient verbalized understanding of and has agreed to the management plan. Patient is aware to call the clinic if they develop any new symptoms or if symptoms persist or worsen. Patient is aware when to return to the clinic for a follow-up visit. Patient educated on when it is appropriate to go to the emergency department.  Return if symptoms worsen or fail to improve.  Carla Mason [name], DNP [facility] [address] [place], [place] [address] [phone]  Note: This document was prepared by [employer] voice dictation technology and any errors that results from this process are unintentional.

## 2023-10-01 LAB — NOVEL CORONAVIRUS, NAA: SARS-CoV-2, NAA: NOT DETECTED

## 2023-10-01 LAB — VITAMIN D 25 HYDROXY (VIT D DEFICIENCY, FRACTURES): Vit D, 25-Hydroxy: 46.2 ng/mL (ref 30.0–100.0)

## 2023-10-14 ENCOUNTER — Ambulatory Visit: Payer: Self-pay | Admitting: Family Medicine

## 2023-10-14 ENCOUNTER — Ambulatory Visit: Payer: BC Managed Care – PPO | Admitting: Nurse Practitioner

## 2023-10-14 ENCOUNTER — Encounter: Payer: Self-pay | Admitting: Nurse Practitioner

## 2023-10-14 VITALS — BP 129/75 | HR 82 | Temp 97.4°F | Ht 61.0 in | Wt 249.6 lb

## 2023-10-14 DIAGNOSIS — R197 Diarrhea, unspecified: Secondary | ICD-10-CM | POA: Diagnosis not present

## 2023-10-14 MED ORDER — METAMUCIL SMOOTH TEXTURE 58.6 % PO POWD: 1.0000 | Freq: Two times a day (BID) | ORAL | 12 refills | Status: DC | PRN

## 2023-10-14 NOTE — Patient Instructions (Addendum)
    2. Diet for Diarrhea: When you have diarrhea, it's best to follow a bland, easy-to-digest diet to avoid irritating your stomach and intestines further. Here's a guideline:  The BRAT Diet: Bananas Rice (white rice) Applesauce Toast (plain, white bread) These foods are low in fiber, help absorb liquid, and are gentle on the digestive system. They can be a good starting point while recovering from diarrhea.  Other Recommended Foods: Boiled potatoes (without butter or seasoning) Plain crackers Cooked carrots or squash Plain pasta Clear broths or soups (without spicy ingredients) Foods to Avoid: High-fiber foods (e.g., beans, lentils, raw vegetables) can irritate the digestive system. Fatty, greasy, or fried foods as they can make diarrhea worse. Spicy foods can irritate the stomach and intestines. Dairy products (especially if you're lactose intolerant) as they can aggravate diarrhea. Caffeinated beverages as they can increase bowel movements. Alcohol which can worsen dehydration and irritation.

## 2023-10-14 NOTE — Telephone Encounter (Signed)
Copied from CRM [id number]. Topic: Clinical - Pink Word Triage >> Oct 14, 2023  7:51 AM Dimitri Ped wrote: Reason for Triage: patient is calling cause  Friday I came home early . Then the weekend had diarrhea. Drink the whole bottle Pedialyte. Felt better . Went to work and ate and  and its just coming out of me like water . Not running  a fever just everything running out of me . Call back number [phone]   Chief Complaint: diarrhea Symptoms: cold sore on lip Frequency: ongoing since Friday night Pertinent Negatives: Patient denies fever or abdominal pain Disposition: [] ED /[] Urgent Care (no appt availability in office) / [x] Appointment(In office/virtual)/ []  [facility]/ [] Home Care/ [] Refused Recommended Disposition /[] [facility]/ []  Follow-up with PCP Additional Notes: The patient reported that she left work early Friday night due to not feeling well.  She went to bed without eating dinner and woke up in the middle of the night with diarrhea.  She had diarrhea all day Saturday and Sunday.  She drank plenty of fluids including Pedialyte and she felt well enough to go to work yesterday.  She worked 10 hours.  When she got home from work, she ate a very light dinner and went to bed.  She work up and had diarrhea throughout the night into this morning, 5-6 times.  She took imodium.  She denied a fever or abdominal pain.  She continues to drink fluids.  She was scheduled for a same day appointment for further assistance. Reason for Disposition  [1] MODERATE diarrhea (e.g., 4-6 times / day more than normal) AND [2] present > 48 hours (2 days)  Answer Assessment - Initial Assessment Questions 1. DIARRHEA SEVERITY: "How bad is the diarrhea?" "How many more stools have you had in the past 24 hours than normal?"    - NO DIARRHEA (SCALE 0)   - MILD (SCALE 1-3): Few loose or mushy BMs; increase of 1-3 stools over normal daily number of stools; mild increase in ostomy output.   -   MODERATE (SCALE 4-7): Increase of 4-6 stools daily over normal; moderate increase in ostomy output.   -  SEVERE (SCALE 8-10; OR "WORST POSSIBLE"): Increase of 7 or more stools daily over normal; moderate increase in ostomy output; incontinence.     5-6 times between the middle of the night and this morning  2. ONSET: "When did the diarrhea begin?"      Friday night felt bad and left work early and went to bed.  In the middle of the night she had to get up multiple times to have diarrhea  3. BM CONSISTENCY: "How loose or watery is the diarrhea?"      Loose like mud 4. VOMITING: "Are you also vomiting?" If Yes, ask: "How many times in the past 24 hours?"      None 5. ABDOMEN PAIN: "Are you having any abdomen pain?" If Yes, ask: "What does it feel like?" (e.g., crampy, dull, intermittent, constant)      None 6. ABDOMEN PAIN SEVERITY: If present, ask: "How bad is the pain?"  (e.g., Scale 1-10; mild, moderate, or severe)   - MILD (1-3): doesn't interfere with normal activities, abdomen soft and not tender to touch    - MODERATE (4-7): interferes with normal activities or awakens from sleep, abdomen tender to touch    - SEVERE (8-10): excruciating pain, doubled over, unable to do any normal activities       None  7. ORAL INTAKE: If vomiting, "Have you been able to drink liquids?" "How much liquids have you had in the past 24 hours?"     Gatorade and some water  8. HYDRATION: "Any signs of dehydration?" (e.g., dry mouth [not just dry lips], too weak to stand, dizziness, new weight loss) "When did you last urinate?"     Still urinating; drinking fluids 9. EXPOSURE: "Have you traveled to a foreign country recently?" "Have you been exposed to anyone with diarrhea?" "Could you have eaten any food that was spoiled?"     Not that she knows of  10. ANTIBIOTIC USE: "Are you taking antibiotics now or have you taken antibiotics in the past 2 months?"       No 11. OTHER SYMPTOMS: "Do you have any other  symptoms?" (e.g., fever, blood in stool)       None Cold sore on lip  Protocols used: Diarrhea-A-AH

## 2023-10-14 NOTE — Progress Notes (Signed)
Acute Office Visit  Subjective:     Patient ID: Carla Mason, female    DOB: 10/26/1960, 63 y.o.   MRN: [id number]  Chief Complaint  Patient presents with   Diarrhea    Started having diarrhea Friday, took immodium Sunday got better, by Monday night having diarrhea again   Had diarrhea for 3-days took immodium and went away went to work had blurry muffin, yoguort and poataties  and dirrhe cam back  Still taking immodium HPI Carla Mason 63 year old female present October 14, 2023 for an acute visit concern for diarrhea Diarrhea new issue:  63 year old female presents with a 3-day history of diarrhea. She initially took Imodium, which temporarily alleviated the symptoms. After returning to work, she had a meal consisting of a muffin, yogurt, and potatoes soup, following which her diarrhea returned. The patient does not report any fever, abdominal pain, or blood in the stool. No other significant symptoms are noted. She denies recent travel, changes in diet, or known exposure to ill individuals. The patient has no known chronic gastrointestinal conditions. Active Ambulatory Problems    Diagnosis Date Noted   Diabetes mellitus treated with oral medication (HCC) 01/04/2013   Neuropathy 01/04/2013   Eczema, dyshidrotic 01/04/2013   Hyperlipidemia associated with type 2 diabetes mellitus (HCC)    Vitamin D deficiency disease    Peripheral edema 10/21/2014   Severe obesity (BMI >= 40) (HCC) 02/03/2015   Seborrheic keratosis 01/12/2016   Aortic valve sclerosis 05/01/2018   Primary osteoarthritis of both knees 11/13/2018   Hypertension associated with diabetes (HCC) 05/06/2022   History of partial hysterectomy 11/08/2022   Diarrhea 10/14/2023   Resolved Ambulatory Problems    Diagnosis Date Noted   Other and unspecified hyperlipidemia 01/04/2013   Essential hypertension, benign 01/04/2013   Edema 05/19/2015   Skin lesion 01/12/2016   Healthcare maintenance 05/03/2016   Past  Medical History:  Diagnosis Date   Diabetes mellitus without complication (HCC)    Hyperlipidemia    Hypertension    Obesity     Review of Systems  Constitutional:  Negative for chills and fever.  HENT:  Negative for congestion and sore throat.   Respiratory:  Negative for hemoptysis and shortness of breath.   Cardiovascular:  Negative for chest pain, palpitations and leg swelling.  Gastrointestinal:  Positive for diarrhea. Negative for nausea and vomiting.       On and off for 5 days  Genitourinary:  Negative for frequency, hematuria and urgency.  Skin:  Negative for itching and rash.  Neurological:  Negative for dizziness and headaches.   Negative unless indicated in HPI    Objective:    BP 129/75   Pulse 82   Temp (!) 97.4 F (36.3 C) (Temporal)   Ht 5\' 1"  (1.549 m)   Wt 249 lb 9.6 oz (113.2 kg)   SpO2 94%   BMI 47.16 kg/m  BP Readings from Last 3 Encounters:  10/14/23 129/75  09/30/23 117/77  09/02/23 118/83   Wt Readings from Last 3 Encounters:  10/14/23 249 lb 9.6 oz (113.2 kg)  09/30/23 250 lb 9.6 oz (113.7 kg)  09/02/23 252 lb (114.3 kg)      Physical Exam Vitals and nursing note reviewed.  Constitutional:      Appearance: She is obese.  HENT:     Head: Normocephalic and atraumatic.     Nose: Nose normal.  Eyes:     General: No scleral icterus.    Extraocular Movements: Extraocular  movements intact.     Conjunctiva/sclera: Conjunctivae normal.     Pupils: Pupils are equal, round, and reactive to light.  Cardiovascular:     Rate and Rhythm: Normal rate and regular rhythm.  Pulmonary:     Effort: Pulmonary effort is normal.     Breath sounds: Normal breath sounds.  Abdominal:     General: Bowel sounds are normal. There is no distension.     Palpations: Abdomen is soft. There is no mass.  Skin:    General: Skin is warm and dry.     Findings: No rash.  Neurological:     Mental Status: She is alert and oriented to person, place, and time. Mental  status is at baseline.  Psychiatric:        Mood and Affect: Mood normal.        Behavior: Behavior normal.        Judgment: Judgment normal.     No results found for any visits on 10/14/23.      Assessment & Plan:  Diarrhea, unspecified type -     Cdiff NAA+O+P+Stool Culture -     Metamucil Smooth Texture; Take 1 packet by mouth 2 (two) times daily as needed (diarrhea).  Dispense: 283 g; Refill: 39    Carla Mason 53-year-old Caucasian female seen today for diarrhea, no acute distress Diarrhea: Ordered stool culture, client instructed to go to the lab get the sample kit and to bring sample back to the lab once collected. Start Metamucil psyllium fiber to improve diarrhea , Continue taking over-the-counter Imodium Increase hydration The 'BRAT' diet is suggested, then progress to diet as tolerated as symptoms abate. Call if bloody stools, persistent diarrhea, vomiting, fever or abdominal pain.   Encourage healthy lifestyle choices, including diet (rich in fruits, vegetables, and lean proteins, and low in salt and simple carbohydrates) and exercise (at least 30 minutes of moderate physical activity daily).     The above assessment and management plan was discussed with the patient. The patient verbalized understanding of and has agreed to the management plan. Patient is aware to call the clinic if they develop any new symptoms or if symptoms persist or worsen. Patient is aware when to return to the clinic for a follow-up visit. Patient educated on when it is appropriate to go to the emergency department.  Return if symptoms worsen or fail to improve.  [name], [facility] [address] [place], [place] [address] [phone]  Note: This document was prepared by Reubin Milan voice dictation technology and any errors that results from this process are unintentional.

## 2023-10-15 ENCOUNTER — Other Ambulatory Visit: Payer: BC Managed Care – PPO

## 2023-10-15 DIAGNOSIS — R197 Diarrhea, unspecified: Secondary | ICD-10-CM | POA: Diagnosis not present

## 2023-10-20 ENCOUNTER — Other Ambulatory Visit: Payer: Self-pay | Admitting: Nurse Practitioner

## 2023-10-20 DIAGNOSIS — R195 Other fecal abnormalities: Secondary | ICD-10-CM | POA: Insufficient documentation

## 2023-10-20 MED ORDER — FIDAXOMICIN 200 MG PO TABS: 200.0000 mg | ORAL_TABLET | Freq: Two times a day (BID) | ORAL | 0 refills | Status: DC

## 2023-10-21 ENCOUNTER — Telehealth: Payer: Self-pay | Admitting: Family Medicine

## 2023-10-21 LAB — CDIFF NAA+O+P+STOOL CULTURE
E coli, Shiga toxin Assay: NEGATIVE
Toxigenic C. Difficile by PCR: POSITIVE — AB

## 2023-10-21 NOTE — Telephone Encounter (Signed)
Copied from CRM [id number]. Topic: General - Other >> Oct 21, 2023  4:38 PM Louie Casa B wrote: Reason for CRM: patient is calling because [employer] says that her rx can not be given to the patient until they here from the dr

## 2023-10-22 ENCOUNTER — Other Ambulatory Visit: Payer: Self-pay | Admitting: *Deleted

## 2023-10-22 ENCOUNTER — Telehealth: Payer: Self-pay

## 2023-10-22 ENCOUNTER — Other Ambulatory Visit (HOSPITAL_COMMUNITY): Payer: Self-pay

## 2023-10-22 DIAGNOSIS — J069 Acute upper respiratory infection, unspecified: Secondary | ICD-10-CM

## 2023-10-22 DIAGNOSIS — R0981 Nasal congestion: Secondary | ICD-10-CM

## 2023-10-22 MED ORDER — FLUTICASONE PROPIONATE 50 MCG/ACT NA SUSP: 2.0000 | Freq: Every day | NASAL | 1 refills | Status: AC

## 2023-10-22 NOTE — Telephone Encounter (Signed)
Clinical questions answered and PA submitted

## 2023-10-22 NOTE — Telephone Encounter (Signed)
Pharmacy Patient Advocate Encounter   Received notification from Patient Pharmacy that prior authorization for Dificid 200mg  tabs is required/requested.   Insurance verification completed.   The patient is insured through  [employer]  .   Per test claim: PA required; PA started via CoverMyMeds. KEY [id number] . Waiting for clinical questions to populate.

## 2023-10-27 ENCOUNTER — Other Ambulatory Visit (HOSPITAL_COMMUNITY): Payer: Self-pay

## 2023-10-27 NOTE — Telephone Encounter (Signed)
Placed a call to [employer] at [phone] to check the status of the prior authorization. Left a voicemail for them to call back.

## 2023-10-29 ENCOUNTER — Ambulatory Visit: Payer: Self-pay | Admitting: Family Medicine

## 2023-10-29 NOTE — Telephone Encounter (Signed)
 Pt called to follow up on PA status for Fidaxomicin . Pt states she has not gotten the medication yet. Reason for Disposition . Health Information question, no triage required and triager able to answer question  Answer Assessment - Initial Assessment Questions 1. REASON FOR CALL or QUESTION: What is your reason for calling today? or How can I best help you? or What question do you have that I can help answer?     Med prior auth status  Protocols used: Information Only Call - No Triage-A-AH

## 2023-10-30 ENCOUNTER — Other Ambulatory Visit (HOSPITAL_COMMUNITY): Payer: Self-pay

## 2023-10-30 ENCOUNTER — Telehealth: Payer: Self-pay | Admitting: Family Medicine

## 2023-10-30 NOTE — Telephone Encounter (Signed)
 Lm on vm regarding approval of medication. LS

## 2023-10-30 NOTE — Telephone Encounter (Signed)
 Copied from CRM [id number]. Topic: Clinical - Prescription Issue >> Oct 29, 2023  5:10 PM Felizardo Hotter wrote: Reason for CRM: Pt needs a prior authorization script for Fidaxomicin  200 mg BID for 10 days Please call pt [phone].

## 2023-10-30 NOTE — Telephone Encounter (Signed)
 Pharmacy Patient Advocate Encounter  Received notification from  [employer]  that Prior Authorization for Dificid  200mg  has been APPROVED from 10/27/23 to 11/24/23   PA #/Case ID/Reference #: [id number]  Per the representative, the plan is still working on the approval and is going to reach out to the patient and will fax over the approval letter.   Phone# [phone]

## 2023-10-30 NOTE — Telephone Encounter (Signed)
 Documented in chart. PA already completed.

## 2023-10-30 NOTE — Telephone Encounter (Signed)
 Patient notified via vm and Mychart. LS

## 2023-10-31 ENCOUNTER — Other Ambulatory Visit (HOSPITAL_COMMUNITY): Payer: Self-pay

## 2023-11-03 ENCOUNTER — Other Ambulatory Visit (HOSPITAL_COMMUNITY): Payer: Self-pay

## 2023-12-07 ENCOUNTER — Other Ambulatory Visit: Payer: Self-pay | Admitting: Family Medicine

## 2023-12-07 DIAGNOSIS — E1142 Type 2 diabetes mellitus with diabetic polyneuropathy: Secondary | ICD-10-CM

## 2023-12-26 ENCOUNTER — Ambulatory Visit: Payer: BC Managed Care – PPO | Admitting: Family Medicine

## 2023-12-26 ENCOUNTER — Encounter: Payer: Self-pay | Admitting: Family Medicine

## 2023-12-26 VITALS — BP 107/63 | HR 80 | Temp 98.3°F | Ht 61.0 in

## 2023-12-26 DIAGNOSIS — E1159 Type 2 diabetes mellitus with other circulatory complications: Secondary | ICD-10-CM

## 2023-12-26 DIAGNOSIS — E1169 Type 2 diabetes mellitus with other specified complication: Secondary | ICD-10-CM

## 2023-12-26 DIAGNOSIS — Z6841 Body Mass Index (BMI) 40.0 and over, adult: Secondary | ICD-10-CM

## 2023-12-26 DIAGNOSIS — E119 Type 2 diabetes mellitus without complications: Secondary | ICD-10-CM

## 2023-12-26 DIAGNOSIS — Z7984 Long term (current) use of oral hypoglycemic drugs: Secondary | ICD-10-CM | POA: Diagnosis not present

## 2023-12-26 DIAGNOSIS — E785 Hyperlipidemia, unspecified: Secondary | ICD-10-CM

## 2023-12-26 DIAGNOSIS — Z0001 Encounter for general adult medical examination with abnormal findings: Secondary | ICD-10-CM

## 2023-12-26 DIAGNOSIS — I152 Hypertension secondary to endocrine disorders: Secondary | ICD-10-CM

## 2023-12-26 DIAGNOSIS — Z0000 Encounter for general adult medical examination without abnormal findings: Secondary | ICD-10-CM

## 2023-12-26 DIAGNOSIS — F4320 Adjustment disorder, unspecified: Secondary | ICD-10-CM

## 2023-12-26 LAB — BAYER DCA HB A1C WAIVED: HB A1C (BAYER DCA - WAIVED): 6 % — ABNORMAL HIGH (ref 4.8–5.6)

## 2023-12-26 NOTE — Progress Notes (Signed)
 Carla Mason is a 63 y.o. female presents to office today for annual physical exam examination.    Concerns today include: 1. Type 2 Diabetes with hypertension, hyperlipidemia:  Compliant with all medications.  Utilizes freestyle libre 2 for monitoring of blood sugars.  Average 30-day blood sugar was 158.  She has not been compliant with diet over the last few days because her husband just passed away and they buried him Wednesday.  She feels well supported by her family.  She does not wish for any interventions at this time but does know that she has option for hospice counseling services at her leisure.  She is very involved in the church  Last eye exam: UTD Last foot exam: UTD Last A1c:  Lab Results  Component Value Date   HGBA1C 6.2 (H) 09/02/2023   Nephropathy screen indicated?:needs Last flu, zoster and/or pneumovax:  Immunization History  Administered Date(s) Administered   Influenza,inj,Quad PF,6+ Mos 06/24/2023   Influenza-Unspecified 06/23/2017, 07/05/2020   PFIZER(Purple Top)SARS-COV-2 Vaccination 06/03/2020, 06/23/2020   Td 05/15/2011   Td (Adult) 05/15/2011   Tdap 05/15/2011, 09/02/2023    ROS: No chest pain, shortness of breath, edema.   Marital status: widowed, Substance use: none Health Maintenance Due  Topic Date Due   Pneumococcal Vaccine 34-56 Years old (1 of 2 - PCV) Never done   Zoster Vaccines- Shingrix (1 of 2) Never done   COVID-19 Vaccine (3 - Pfizer risk series) 07/21/2020   Diabetic kidney evaluation - Urine ACR  11/09/2023   Refills needed today: none  Immunization History  Administered Date(s) Administered   Influenza,inj,Quad PF,6+ Mos 06/24/2023   Influenza-Unspecified 06/23/2017, 07/05/2020   PFIZER(Purple Top)SARS-COV-2 Vaccination 06/03/2020, 06/23/2020   Td 05/15/2011   Td (Adult) 05/15/2011   Tdap 05/15/2011, 09/02/2023   Past Medical History:  Diagnosis Date   Diabetes mellitus without complication (HCC)    Hyperlipidemia     Hypertension    Neuropathy    Obesity    Vitamin D deficiency disease    Social History   Socioeconomic History   Marital status: Married    Spouse name: Not on file   Number of children: Not on file   Years of education: Not on file   Highest education level: Not on file  Occupational History   Not on file  Tobacco Use   Smoking status: Never   Smokeless tobacco: Never  Vaping Use   Vaping status: Never Used  Substance and Sexual Activity   Alcohol use: Never   Drug use: Never   Sexual activity: Not on file  Other Topics Concern   Not on file  Social History Narrative   Not on file   Social Drivers of Health   Financial Resource Strain: Not on file  Food Insecurity: Not on file  Transportation Needs: Not on file  Physical Activity: Not on file  Stress: Not on file  Social Connections: Unknown (02/01/2022)   [facility]   Social Network    Social Network: Not on file  Intimate Partner Violence: Unknown (12/25/2021)   Received from [employer], [facility]   HITS    Physically Hurt: Not on file    Insult or Talk Down To: Not on file    Threaten Physical Harm: Not on file    Scream or Curse: Not on file   Past Surgical History:  Procedure Laterality Date   ABDOMINAL HYSTERECTOMY     KNEE SURGERY  Family History  Problem Relation Age of Onset   Heart attack Mother    Cancer Father    CVA Maternal Grandmother    Heart attack Maternal Grandfather    Cancer Sister    Early death Brother    Heart attack Sister    Healthy Sister     Current Outpatient Medications:    Accu-Chek Softclix Lancets lancets, Use as instructed, Disp: 100 each, Rfl: 12   amLODipine-olmesartan (AZOR) 5-40 MG tablet, Take 1 tablet by mouth daily., Disp: 90 tablet, Rfl: 3   aspirin 81 MG chewable tablet, Chew 81 mg by mouth daily., Disp: , Rfl:    atorvastatin (LIPITOR) 40 MG tablet, Take 1 tablet (40 mg total) by mouth daily., Disp: 90 tablet,  Rfl: 3   benzonatate (TESSALON PERLES) 100 MG capsule, Take 1 capsule (100 mg total) by mouth 3 (three) times daily as needed., Disp: 30 capsule, Rfl: 0   betamethasone, augmented, (DIPROLENE) 0.05 % lotion, APPLY TO AFFECTED AREA TWICE A DAY, Disp: 60 mL, Rfl: 2   blood glucose meter kit and supplies, Dispense based on patient and insurance preference (wants freestyle). Use up to one time daily as directed. (FOR ICD-10 E10.9, E11.9)., Disp: 1 each, Rfl: 0   Blood Glucose Monitoring Suppl DEVI, Check BGs daily E11.42. May substitute to any manufacturer covered by patient's insurance., Disp: 1 each, Rfl: 0   cholecalciferol (VITAMIN D) 1000 UNITS tablet, Take 2,000 Units by mouth daily., Disp: , Rfl:    ciclopirox (PENLAC) 8 % solution, Apply topically at bedtime. Apply over nail and surrounding skin. Apply daily over previous coat. After seven (7) days, may remove with alcohol and continue cycle., Disp: 6.6 mL, Rfl: 0   clobetasol ointment (TEMOVATE) 0.05 %, Apply topically daily., Disp: , Rfl:    Continuous Blood Gluc Receiver (FREESTYLE LIBRE 2 READER) DEVI, 1 EACH BY DOES NOT APPLY ROUTE, Disp: 1 each, Rfl: 0   Continuous Glucose Sensor (FREESTYLE LIBRE 2 SENSOR) MISC, USE AS DIRECTED EVERY 14 DAYS. E11.9, Disp: 2 each, Rfl: 12   fidaxomicin (DIFICID) 200 MG TABS tablet, Take 1 tablet (200 mg total) by mouth 2 (two) times daily., Disp: 20 tablet, Rfl: 0   fluticasone (FLONASE) 50 MCG/ACT nasal spray, Place 2 sprays into both nostrils daily., Disp: 48 g, Rfl: 1   folic acid (FOLVITE) 1 MG tablet, Take 1 mg by mouth daily., Disp: , Rfl:    furosemide (LASIX) 20 MG tablet, Take 1 tablet (20 mg total) by mouth daily., Disp: 90 tablet, Rfl: 3   glipiZIDE (GLUCOTROL XL) 10 MG 24 hr tablet, Take 1 tablet (10 mg total) by mouth daily with breakfast. For diabetes, Disp: 90 tablet, Rfl: 3   Glucose Blood (BLOOD GLUCOSE TEST STRIPS) STRP, Check BGs daily E11.42.May substitute to any manufacturer covered by  patient's insurance., Disp: 100 strip, Rfl: 3   guaiFENesin (MUCINEX) 600 MG 12 hr tablet, Take 1 tablet (600 mg total) by mouth 2 (two) times daily., Disp: 30 tablet, Rfl: 0   Lancet Device MISC, Check BGs daily E11.42.May substitute to any manufacturer covered by patient's insurance., Disp: 1 each, Rfl: 0   Lancets Misc. MISC, Check BGs daily E11.42.May substitute to any manufacturer covered by patient's insurance., Disp: 100 each, Rfl: 3   psyllium (METAMUCIL SMOOTH TEXTURE) 58.6 % powder, Take 1 packet by mouth 2 (two) times daily as needed (diarrhea)., Disp: 283 g, Rfl: 12   SitaGLIPtin-MetFORMIN HCl (JANUMET XR) [phone] MG TB24, Take 1 tablet  by mouth daily., Disp: 90 tablet, Rfl: 3   triamcinolone cream (KENALOG) 0.1 %, Apply 1 application topically 2 (two) times daily. X10 days, Disp: 60 g, Rfl: 0   Vitamin D, Ergocalciferol, (DRISDOL) 1.25 MG (50000 UNIT) CAPS capsule, TAKE 1 CAPSULE (50,000 UNITS TOTAL) BY MOUTH EVERY 7 (SEVEN) DAYS, Disp: 12 capsule, Rfl: 0  No Known Allergies   ROS: Review of Systems Pertinent items noted in HPI and remainder of comprehensive ROS otherwise negative.    Physical exam BP 107/63   Pulse 80   Temp 98.3 F (36.8 C)   Ht 5\' 1"  (1.549 m)   SpO2 90%   BMI 47.16 kg/m  General appearance: alert, cooperative, appears stated age, and morbidly obese Head: Normocephalic, without obvious abnormality, atraumatic Eyes: conjunctivae/corneas clear. PERRL, EOM's intact. Fundi benign. Ears: normal TM and external ear canal right ear Nose: Nares normal. Septum midline. Mucosa normal. No drainage or sinus tenderness. Throat: lips, mucosa, and tongue normal; teeth and gums normal Neck: no adenopathy, supple, symmetrical, trachea midline, and thyroid not enlarged, symmetric, no tenderness/mass/nodules Back: symmetric, no curvature. ROM normal. No CVA tenderness.  Antalgic gait.  Stiff with ambulation Lungs: clear to auscultation bilaterally Heart:  Blowing  systolic murmur present.  This does not radiate to her carotids Abdomen: soft, non-tender; bowel sounds normal; no masses,  no organomegaly and obese Extremities: extremities normal, atraumatic, no cyanosis or edema Pulses: 2+ and symmetric Skin: Skin color, texture, turgor normal. No rashes or lesions Lymph nodes: Cervical, supraclavicular, and axillary nodes normal. Neurologic: Grossly normal      12/26/2023   12:59 PM 09/02/2023    2:03 PM 05/09/2023   10:53 AM  Depression screen PHQ 2/9  Decreased Interest 0 0 0  Down, Depressed, Hopeless 0 0 0  PHQ - 2 Score 0 0 0  Altered sleeping 0 0 0  Tired, decreased energy 0 0 0  Change in appetite 0 0 0  Feeling bad or failure about yourself  0 0 0  Trouble concentrating 0 0 0  Moving slowly or fidgety/restless 0 0 0  Suicidal thoughts 0 0 0  PHQ-9 Score 0 0 0  Difficult doing work/chores Not difficult at all Not difficult at all Not difficult at all      12/26/2023   12:59 PM 09/02/2023    2:05 PM 05/09/2023   10:53 AM 02/07/2023   12:00 PM  GAD 7 : Generalized Anxiety Score  Nervous, Anxious, on Edge 0 0 0 0  Control/stop worrying 0 0 0 0  Worry too much - different things 0 0 0 0  Trouble relaxing 0 0 0 0  Restless 0 0 0 0  Easily annoyed or irritable 0 0 0 0  Afraid - awful might happen 0 0 0 0  Total GAD 7 Score 0 0 0 0  Anxiety Difficulty Not difficult at all Not difficult at all Not difficult at all Not difficult at all     Assessment/ Plan: Carla Mason here for annual physical exam.   Annual physical exam  Diabetes mellitus treated with oral medication (HCC) - Plan: Bayer DCA Hb A1c Waived, Microalbumin / creatinine urine ratio  Hypertension associated with diabetes (HCC)  Hyperlipidemia associated with type 2 diabetes mellitus (HCC)  Severe obesity (BMI >= 40) (HCC)  Grief reaction  Declines vaccines.    Sugar remains under good control.  No changes needed.  Urine microalbumin collected.  Up-to-date on  fasting labs  Blood pressure controlled.  No changes  Continue statin  Seems to be coping independently with her grief after the loss of her husband this month.  Encouraged her to contact me at any time should she need intervention and/or a checkup prior to her next scheduled visit  Counseled on healthy lifestyle choices, including diet (rich in fruits, vegetables and lean meats and low in salt and simple carbohydrates) and exercise (at least 30 minutes of moderate physical activity daily).  Patient to follow up 6 months for DM  Kylyn Sookram M. Nadine Counts, DO

## 2023-12-28 LAB — MICROALBUMIN / CREATININE URINE RATIO
Creatinine, Urine: 161.2 mg/dL
Microalb/Creat Ratio: 9 mg/g{creat} (ref 0–29)
Microalbumin, Urine: 14.2 ug/mL

## 2024-01-01 ENCOUNTER — Ambulatory Visit: Payer: Self-pay

## 2024-01-01 DIAGNOSIS — H01004 Unspecified blepharitis left upper eyelid: Secondary | ICD-10-CM | POA: Diagnosis not present

## 2024-01-01 NOTE — Telephone Encounter (Signed)
 Copied from CRM [id number]. Topic: Clinical - Red Word Triage >> Jan 01, 2024  3:46 PM Makayla J wrote: Red Word that prompted transfer to Nurse Triage: Open sore/Swelling inside of left   Chief Complaint: Left Eye Sore/Swelling Symptoms: sore Frequency: x 3-4 days Pertinent Negatives: Patient denies injuries, fever, blurry vision, runny nose, rashes Disposition: [] ED /[x] Urgent Care (no appt availability in office) / [] Appointment(In office/virtual)/ []  [facility]/ [] Home Care/ [] Refused Recommended Disposition /[] [facility]/ []  Follow-up with PCP Additional Notes: Patient called and advised that her left eye Patient states that her husband recently died and she went back to work Tuesday and her eye started hurting that day. She states that on the inside of her upper eyelid is an open sore/wound area.  She is unsure if it is a sty because she thought that those usually appear on the outside of the eyelid.  She denies any injuries to her eyes, fever that she is aware of, blurry vision, runny nose, or rashes.  She states she does have some congestion.  She states that around the outside of her left eye it is starting to swell like fluid is collecting in this area. Patient states that she called her eye doctors office and described what she had going on with this left eye to them and they advised her that she needed to be going to an Urgent Care to get this eye evaluated as soon as she could. This RN advised her that if her eye doctor's office recommended that she go to Urgent Care, this RN feels like that would be an appropriate route to take at this time.  She states that there is some pain but this area around the outside corner of her eye is swelling. Patient states that she is going to go get it checked out now either at an Urgent Care or she may go to the eye doctor's office that she was speaking to prior to triage. Patient is advised that if it gets worse to go to the  Emergency Room.  Patient verbalized understanding.  Reason for Disposition  MODERATE-SEVERE eyelid swelling on one side  (Exception: Due to a mosquito bite.)  Answer Assessment - Initial Assessment Questions 1. ONSET: "When did the swelling start?" (e.g., minutes, hours, days)     3-4 days ago 2. LOCATION: "What part of the eyelids is swollen?"     Corner and on eyelid 3. SEVERITY: "How swollen is it?"     Around the outside corner  4. ITCHING: "Is there any itching?" If Yes, ask: "How much?"   (Scale 1-10; mild, moderate or severe)     No 5. PAIN: "Is the swelling painful to touch?" If Yes, ask: "How painful is it?"   (Scale 1-10; mild, moderate or severe)     "Doesn't hurt much 6. FEVER: "Do you have a fever?" If Yes, ask: "What is it, how was it measured, and when did it start?"      No 7. CAUSE: "What do you think is causing the swelling?"     unknown 8. RECURRENT SYMPTOM: "Have you had eyelid swelling before?" If Yes, ask: "When was the last time?" "What happened that time?"     No 9. OTHER SYMPTOMS: "Do you have any other symptoms?" (e.g., blurred vision, eye discharge, rash, runny nose)  Protocols used: Eye - Swelling-A-AH

## 2024-01-29 ENCOUNTER — Other Ambulatory Visit: Payer: Self-pay | Admitting: Family Medicine

## 2024-01-29 DIAGNOSIS — M810 Age-related osteoporosis without current pathological fracture: Secondary | ICD-10-CM

## 2024-01-30 DIAGNOSIS — L719 Rosacea, unspecified: Secondary | ICD-10-CM | POA: Diagnosis not present

## 2024-01-30 DIAGNOSIS — L900 Lichen sclerosus et atrophicus: Secondary | ICD-10-CM | POA: Diagnosis not present

## 2024-04-05 ENCOUNTER — Ambulatory Visit: Payer: BC Managed Care – PPO | Admitting: Family Medicine

## 2024-04-20 ENCOUNTER — Other Ambulatory Visit: Payer: Self-pay | Admitting: Family Medicine

## 2024-04-20 DIAGNOSIS — M810 Age-related osteoporosis without current pathological fracture: Secondary | ICD-10-CM

## 2024-05-26 ENCOUNTER — Other Ambulatory Visit: Payer: Self-pay | Admitting: Family Medicine

## 2024-05-26 DIAGNOSIS — M810 Age-related osteoporosis without current pathological fracture: Secondary | ICD-10-CM

## 2024-06-02 LAB — HM DIABETES EYE EXAM

## 2024-06-29 ENCOUNTER — Ambulatory Visit: Payer: Self-pay | Admitting: Family Medicine

## 2024-06-29 ENCOUNTER — Ambulatory Visit: Admitting: Family Medicine

## 2024-06-29 ENCOUNTER — Encounter: Payer: Self-pay | Admitting: Family Medicine

## 2024-06-29 VITALS — BP 140/82 | HR 74 | Temp 97.5°F | Ht 65.5 in | Wt 255.5 lb

## 2024-06-29 DIAGNOSIS — E1159 Type 2 diabetes mellitus with other circulatory complications: Secondary | ICD-10-CM | POA: Diagnosis not present

## 2024-06-29 DIAGNOSIS — G4719 Other hypersomnia: Secondary | ICD-10-CM

## 2024-06-29 DIAGNOSIS — Z7984 Long term (current) use of oral hypoglycemic drugs: Secondary | ICD-10-CM

## 2024-06-29 DIAGNOSIS — E119 Type 2 diabetes mellitus without complications: Secondary | ICD-10-CM

## 2024-06-29 DIAGNOSIS — R600 Localized edema: Secondary | ICD-10-CM

## 2024-06-29 DIAGNOSIS — M810 Age-related osteoporosis without current pathological fracture: Secondary | ICD-10-CM

## 2024-06-29 DIAGNOSIS — E1169 Type 2 diabetes mellitus with other specified complication: Secondary | ICD-10-CM | POA: Diagnosis not present

## 2024-06-29 DIAGNOSIS — E785 Hyperlipidemia, unspecified: Secondary | ICD-10-CM

## 2024-06-29 DIAGNOSIS — I152 Hypertension secondary to endocrine disorders: Secondary | ICD-10-CM

## 2024-06-29 LAB — BAYER DCA HB A1C WAIVED: HB A1C (BAYER DCA - WAIVED): 6.2 % — ABNORMAL HIGH (ref 4.8–5.6)

## 2024-06-29 MED ORDER — ATORVASTATIN CALCIUM 40 MG PO TABS: 40.0000 mg | ORAL_TABLET | Freq: Every day | ORAL | 3 refills | Status: AC

## 2024-06-29 MED ORDER — AMLODIPINE-OLMESARTAN 5-40 MG PO TABS: 1.0000 | ORAL_TABLET | Freq: Every day | ORAL | 3 refills | Status: AC

## 2024-06-29 MED ORDER — GLIPIZIDE ER 10 MG PO TB24: 10.0000 mg | ORAL_TABLET | Freq: Every day | ORAL | 3 refills | Status: AC

## 2024-06-29 MED ORDER — FREESTYLE LIBRE 3 PLUS SENSOR MISC: 3 refills | Status: AC

## 2024-06-29 MED ORDER — JANUMET XR 100-1000 MG PO TB24: 1.0000 | ORAL_TABLET | Freq: Every day | ORAL | 3 refills | Status: DC

## 2024-06-29 MED ORDER — FREESTYLE LIBRE 3 READER DEVI: 0 refills | Status: DC

## 2024-06-29 MED ORDER — FUROSEMIDE 20 MG PO TABS: 20.0000 mg | ORAL_TABLET | Freq: Every day | ORAL | 3 refills | Status: AC

## 2024-06-29 NOTE — Patient Instructions (Signed)

## 2024-06-29 NOTE — Progress Notes (Signed)
 Subjective: CC:DM PCP: Jolinda Norene HERO, DO YEP:Carla Mason is a 63 y.o. female presenting to clinic today for:  Type 2 Diabetes with hypertension, hyperlipidemia:  Glucometer: Freestyle Libre 2.  Reports no hypo or hyperglycemic episodes but admits she is not checking regularly.  She is compliant with all medications.  Needs refills on all.  She is fasting this morning  ROS: Denies dizziness, LOC, polyuria, polydipsia, unintended weight loss/gain, foot ulcerations, numbness or tingling in extremities, shortness of breath or chest pain.  She however does report excessive daytime sleepiness by adequate sleep hours.  No known apneic spells but she has wondered if she has sleep apnea   Diabetes Health Maintenance Due  Topic Date Due   HEMOGLOBIN A1C  06/26/2024   FOOT EXAM  09/01/2024   OPHTHALMOLOGY EXAM  06/02/2025    ROS: Per HPI  No Known Allergies Past Medical History:  Diagnosis Date   Diabetes mellitus without complication (HCC)    Hyperlipidemia    Hypertension    Neuropathy    Obesity    Vitamin D  deficiency disease     Current Outpatient Medications:    Accu-Chek Softclix Lancets lancets, Use as instructed, Disp: 100 each, Rfl: 12   amLODipine -olmesartan  (AZOR ) 5-40 MG tablet, Take 1 tablet by mouth daily., Disp: 90 tablet, Rfl: 3   aspirin 81 MG chewable tablet, Chew 81 mg by mouth daily., Disp: , Rfl:    atorvastatin  (LIPITOR) 40 MG tablet, Take 1 tablet (40 mg total) by mouth daily., Disp: 90 tablet, Rfl: 3   blood glucose meter kit and supplies, Dispense based on patient and insurance preference (wants freestyle). Use up to one time daily as directed. (FOR ICD-10 E10.9, E11.9)., Disp: 1 each, Rfl: 0   Blood Glucose Monitoring Suppl DEVI, Check BGs daily E11.42. May substitute to any manufacturer covered by patient's insurance., Disp: 1 each, Rfl: 0   clobetasol ointment (TEMOVATE) 0.05 %, Apply topically daily., Disp: , Rfl:    Continuous Blood Gluc  Receiver (FREESTYLE LIBRE 2 READER) DEVI, 1 EACH BY DOES NOT APPLY ROUTE, Disp: 1 each, Rfl: 0   Continuous Glucose Sensor (FREESTYLE LIBRE 2 SENSOR) MISC, USE AS DIRECTED EVERY 14 DAYS. E11.9, Disp: 2 each, Rfl: 12   furosemide  (LASIX ) 20 MG tablet, Take 1 tablet (20 mg total) by mouth daily., Disp: 90 tablet, Rfl: 3   glipiZIDE  (GLUCOTROL  XL) 10 MG 24 hr tablet, Take 1 tablet (10 mg total) by mouth daily with breakfast. For diabetes, Disp: 90 tablet, Rfl: 3   Glucose Blood (BLOOD GLUCOSE TEST STRIPS) STRP, Check BGs daily E11.42.May substitute to any manufacturer covered by patient's insurance., Disp: 100 strip, Rfl: 3   Lancet Device MISC, Check BGs daily E11.42.May substitute to any manufacturer covered by patient's insurance., Disp: 1 each, Rfl: 0   Lancets Misc. MISC, Check BGs daily E11.42.May substitute to any manufacturer covered by patient's insurance., Disp: 100 each, Rfl: 3   SitaGLIPtin-MetFORMIN  HCl (JANUMET  XR) [phone] MG TB24, Take 1 tablet by mouth daily., Disp: 90 tablet, Rfl: 3   Vitamin D , Ergocalciferol , (DRISDOL ) 1.25 MG (50000 UNIT) CAPS capsule, TAKE 1 CAPSULE (50,000 UNITS TOTAL) BY MOUTH EVERY 7 (SEVEN) DAYS, Disp: 12 capsule, Rfl: 3   betamethasone , augmented, (DIPROLENE ) 0.05 % lotion, APPLY TO AFFECTED AREA TWICE A DAY (Patient not taking: Reported on 06/29/2024), Disp: 60 mL, Rfl: 2   ciclopirox  (PENLAC ) 8 % solution, Apply topically at bedtime. Apply over nail and surrounding skin. Apply daily over previous  coat. After seven (7) days, may remove with alcohol and continue cycle. (Patient not taking: Reported on 06/29/2024), Disp: 6.6 mL, Rfl: 0   fluticasone  (FLONASE ) 50 MCG/ACT nasal spray, Place 2 sprays into both nostrils daily. (Patient not taking: Reported on 06/29/2024), Disp: 48 g, Rfl: 1   triamcinolone  cream (KENALOG ) 0.1 %, Apply 1 application topically 2 (two) times daily. X10 days (Patient not taking: Reported on 06/29/2024), Disp: 60 g, Rfl: 0 Social History    Socioeconomic History   Marital status: Widowed    Spouse name: Not on file   Number of children: Not on file   Years of education: Not on file   Highest education level: Not on file  Occupational History   Not on file  Tobacco Use   Smoking status: Never   Smokeless tobacco: Never  Vaping Use   Vaping status: Never Used  Substance and Sexual Activity   Alcohol use: Never   Drug use: Never   Sexual activity: Not on file  Other Topics Concern   Not on file  Social History Narrative   Widowed 12/2023.   She has 2 grandchildren (through another family member).    She has no biologic children   She is involved in her church.   Social Drivers of Corporate investment banker Strain: Not on file  Food Insecurity: Not on file  Transportation Needs: Not on file  Physical Activity: Not on file  Stress: Not on file  Social Connections: Unknown (02/01/2022)   [facility]   Social Network    Social Network: Not on file  Intimate Partner Violence: Unknown (12/25/2021)   Received from [facility]   HITS    Physically Hurt: Not on file    Insult or Talk Down To: Not on file    Threaten Physical Harm: Not on file    Scream or Curse: Not on file   Family History  Problem Relation Age of Onset   Heart attack Mother    Cancer Father    CVA Maternal Grandmother    Heart attack Maternal Grandfather    Cancer Sister    Early death Brother    Heart attack Sister    Healthy Sister     Objective: Office vital signs reviewed. BP (!) 144/77   Pulse 74   Temp (!) 97.5 F (36.4 C)   Ht 5' 5.5 (1.664 m)   Wt 255 lb 8 oz (115.9 kg)   SpO2 92%   BMI 41.87 kg/m   Physical Examination:  General: Awake, alert, morbidly obese, No acute distress HEENT: Sclera white.  Moist mucous membranes.  Enlarged neck girth Cardio: regular rate and rhythm, S1S2 heard, +2/6 systolic murmurs appreciated best heard at the mid sternal border Pulm: clear to auscultation bilaterally,  no wheezes, rhonchi or rales; normal work of breathing on room air Breast: Macromastia present   Lab Results  Component Value Date   HGBA1C 6.0 (H) 12/26/2023    Assessment/ Plan: 63 y.o. female   Diabetes mellitus treated with oral medication (HCC) - Plan: Bayer DCA Hb A1c Waived, CMP14+EGFR, Continuous Glucose Sensor (FREESTYLE LIBRE 3 PLUS SENSOR) MISC, Continuous Glucose Receiver (FREESTYLE LIBRE 3 READER) DEVI, glipiZIDE  (GLUCOTROL  XL) 10 MG 24 hr tablet, SitaGLIPtin-MetFORMIN  HCl (JANUMET  XR) [phone] MG TB24  Hypertension associated with diabetes (HCC) - Plan: CMP14+EGFR, amLODipine -olmesartan  (AZOR ) 5-40 MG tablet  Hyperlipidemia associated with type 2 diabetes mellitus (HCC) - Plan: CMP14+EGFR, Lipid Panel, atorvastatin  (LIPITOR) 40 MG tablet  Age-related osteoporosis without current pathological fracture - Plan: CMP14+EGFR, DG [facility] DEXA  Peripheral edema - Plan: furosemide  (LASIX ) 20 MG tablet  Excessive daytime sleepiness  I have updated her freestyle libre since the twos are being phased out.  Meds have been renewed  Fasting labs collected.  Blood pressure controlled upon recheck.  DEXA scan.  Up-to-date on vitamin D  testing.  Check renal function, calcium  level  Suspect she has undiagnosed obstructive sleep apnea.  We discussed the risks of cardiovascular disease if left untreated.  She will consider home sleep study and will contact me if she wishes to proceed with this. STOPBANG score 3   Carla Starry CHRISTELLA Fielding, DO [facility] [phone]

## 2024-06-30 LAB — CMP14+EGFR
ALT: 18 IU/L (ref 0–32)
AST: 15 IU/L (ref 0–40)
Albumin: 4.2 g/dL (ref 3.9–4.9)
Alkaline Phosphatase: 95 IU/L (ref 49–135)
BUN/Creatinine Ratio: 14 (ref 12–28)
BUN: 8 mg/dL (ref 8–27)
Bilirubin Total: 0.6 mg/dL (ref 0.0–1.2)
CO2: 23 mmol/L (ref 20–29)
Calcium: 9.2 mg/dL (ref 8.7–10.3)
Chloride: 102 mmol/L (ref 96–106)
Creatinine, Ser: 0.57 mg/dL (ref 0.57–1.00)
Globulin, Total: 2.3 g/dL (ref 1.5–4.5)
Glucose: 122 mg/dL — ABNORMAL HIGH (ref 70–99)
Potassium: 4.5 mmol/L (ref 3.5–5.2)
Sodium: 139 mmol/L (ref 134–144)
Total Protein: 6.5 g/dL (ref 6.0–8.5)
eGFR: 101 mL/min/1.73 (ref >59)

## 2024-06-30 LAB — LIPID PANEL
Chol/HDL Ratio: 4.2 ratio (ref 0.0–4.4)
Cholesterol, Total: 130 mg/dL (ref 100–199)
HDL: 31 mg/dL — ABNORMAL LOW (ref >39)
LDL Chol Calc (NIH): 61 mg/dL (ref 0–99)
Triglycerides: 232 mg/dL — ABNORMAL HIGH (ref 0–149)
VLDL Cholesterol Cal: 38 mg/dL (ref 5–40)

## 2024-07-02 ENCOUNTER — Telehealth: Payer: Self-pay | Admitting: Family Medicine

## 2024-07-02 NOTE — Progress Notes (Signed)
 Pt r/c.

## 2024-07-02 NOTE — Telephone Encounter (Signed)
 Copied from CRM [id number]. Topic: Clinical - Lab/Test Results >> Jul 02, 2024  8:24 AM Avram MATSU wrote: Reason for CRM: please call pt back before 10am she will be leaving out. [phone] (H)

## 2024-07-05 NOTE — Telephone Encounter (Signed)
CALLED PATIENT, NO ANSWER, LEFT MESSAGE TO RETURN CALL 

## 2024-07-06 NOTE — Telephone Encounter (Signed)
 Patient has been contacted. Addressed in result note.

## 2024-09-02 ENCOUNTER — Other Ambulatory Visit: Payer: Self-pay | Admitting: Family Medicine

## 2024-09-02 DIAGNOSIS — E119 Type 2 diabetes mellitus without complications: Secondary | ICD-10-CM

## 2024-09-28 DIAGNOSIS — E119 Type 2 diabetes mellitus without complications: Secondary | ICD-10-CM

## 2025-02-04 ENCOUNTER — Encounter: Payer: Self-pay | Admitting: Family Medicine

## 2025-02-04 ENCOUNTER — Other Ambulatory Visit: Payer: Self-pay
